# Patient Record
Sex: Female | Born: 1960
Health system: Southern US, Community
[De-identification: ages and names within clinical notes are randomized; demographics above are authoritative.]

## PROBLEM LIST (undated history)

## (undated) DIAGNOSIS — F419 Anxiety disorder, unspecified: Secondary | ICD-10-CM

## (undated) DIAGNOSIS — I499 Cardiac arrhythmia, unspecified: Secondary | ICD-10-CM

## (undated) DIAGNOSIS — K219 Gastro-esophageal reflux disease without esophagitis: Secondary | ICD-10-CM

## (undated) DIAGNOSIS — E785 Hyperlipidemia, unspecified: Secondary | ICD-10-CM

## (undated) DIAGNOSIS — T7840XA Allergy, unspecified, initial encounter: Secondary | ICD-10-CM

## (undated) DIAGNOSIS — E079 Disorder of thyroid, unspecified: Secondary | ICD-10-CM

## (undated) HISTORY — DX: Cardiac arrhythmia, unspecified: I49.9

## (undated) HISTORY — PX: ABDOMINAL HYSTERECTOMY: SHX81

## (undated) HISTORY — DX: Anxiety disorder, unspecified: F41.9

## (undated) HISTORY — DX: Gastro-esophageal reflux disease without esophagitis: K21.9

## (undated) HISTORY — DX: Hyperlipidemia, unspecified: E78.5

## (undated) HISTORY — DX: Disorder of thyroid, unspecified: E07.9

## (undated) HISTORY — DX: Allergy, unspecified, initial encounter: T78.40XA

---

## 1997-08-12 ENCOUNTER — Other Ambulatory Visit: Admission: RE | Admit: 1997-08-12 | Discharge: 1997-08-12 | Payer: Self-pay | Admitting: Obstetrics & Gynecology

## 1998-08-17 ENCOUNTER — Other Ambulatory Visit: Admission: RE | Admit: 1998-08-17 | Discharge: 1998-08-17 | Payer: Self-pay | Admitting: Obstetrics & Gynecology

## 1999-09-28 ENCOUNTER — Other Ambulatory Visit: Admission: RE | Admit: 1999-09-28 | Discharge: 1999-09-28 | Payer: Self-pay | Admitting: Obstetrics & Gynecology

## 2000-11-04 ENCOUNTER — Other Ambulatory Visit: Admission: RE | Admit: 2000-11-04 | Discharge: 2000-11-04 | Payer: Self-pay | Admitting: Obstetrics & Gynecology

## 2001-12-02 ENCOUNTER — Other Ambulatory Visit: Admission: RE | Admit: 2001-12-02 | Discharge: 2001-12-02 | Payer: Self-pay | Admitting: Obstetrics & Gynecology

## 2003-01-04 ENCOUNTER — Other Ambulatory Visit: Admission: RE | Admit: 2003-01-04 | Discharge: 2003-01-04 | Payer: Self-pay | Admitting: Obstetrics & Gynecology

## 2004-01-24 ENCOUNTER — Other Ambulatory Visit: Admission: RE | Admit: 2004-01-24 | Discharge: 2004-01-24 | Payer: Self-pay | Admitting: Obstetrics & Gynecology

## 2005-02-19 ENCOUNTER — Other Ambulatory Visit: Admission: RE | Admit: 2005-02-19 | Discharge: 2005-02-19 | Payer: Self-pay | Admitting: Obstetrics & Gynecology

## 2006-06-20 ENCOUNTER — Encounter: Admission: RE | Admit: 2006-06-20 | Discharge: 2006-06-20 | Payer: Self-pay | Admitting: Obstetrics & Gynecology

## 2007-07-29 ENCOUNTER — Encounter: Admission: RE | Admit: 2007-07-29 | Discharge: 2007-07-29 | Payer: Self-pay | Admitting: Obstetrics & Gynecology

## 2008-08-04 ENCOUNTER — Encounter: Admission: RE | Admit: 2008-08-04 | Discharge: 2008-08-04 | Payer: Self-pay | Admitting: Obstetrics & Gynecology

## 2008-08-06 ENCOUNTER — Encounter: Admission: RE | Admit: 2008-08-06 | Discharge: 2008-08-06 | Payer: Self-pay | Admitting: Obstetrics & Gynecology

## 2010-08-07 ENCOUNTER — Other Ambulatory Visit: Payer: Self-pay | Admitting: Obstetrics & Gynecology

## 2010-08-07 DIAGNOSIS — Z1231 Encounter for screening mammogram for malignant neoplasm of breast: Secondary | ICD-10-CM

## 2010-08-15 ENCOUNTER — Ambulatory Visit
Admission: RE | Admit: 2010-08-15 | Discharge: 2010-08-15 | Disposition: A | Discharge status: Home / Self Care | Payer: 59 | Source: Ambulatory Visit | Attending: Obstetrics & Gynecology | Admitting: Obstetrics & Gynecology

## 2010-08-15 DIAGNOSIS — Z1231 Encounter for screening mammogram for malignant neoplasm of breast: Secondary | ICD-10-CM

## 2011-08-07 ENCOUNTER — Other Ambulatory Visit: Payer: Self-pay | Admitting: Obstetrics & Gynecology

## 2011-08-07 DIAGNOSIS — Z1231 Encounter for screening mammogram for malignant neoplasm of breast: Secondary | ICD-10-CM

## 2011-08-21 ENCOUNTER — Ambulatory Visit
Admission: RE | Admit: 2011-08-21 | Discharge: 2011-08-21 | Disposition: A | Discharge status: Home / Self Care | Payer: 59 | Source: Ambulatory Visit | Attending: Obstetrics & Gynecology | Admitting: Obstetrics & Gynecology

## 2011-08-21 DIAGNOSIS — Z1231 Encounter for screening mammogram for malignant neoplasm of breast: Secondary | ICD-10-CM

## 2011-12-11 ENCOUNTER — Encounter: Payer: Self-pay | Admitting: Sports Medicine

## 2011-12-11 ENCOUNTER — Ambulatory Visit (INDEPENDENT_AMBULATORY_CARE_PROVIDER_SITE_OTHER): Payer: 59 | Admitting: Sports Medicine

## 2011-12-11 VITALS — BP 119/78 | HR 68 | Ht 64.0 in | Wt 135.0 lb

## 2011-12-11 DIAGNOSIS — M25579 Pain in unspecified ankle and joints of unspecified foot: Secondary | ICD-10-CM

## 2011-12-11 NOTE — Assessment & Plan Note (Addendum)
Patient's likely with her high arches bilaterally and pain on the lateral aspect where she did have swelling may have a sinus tarsus. This would also go along with the other intervention she's got by her orthopedic physician previously. At this point it seems to have resolved but she's not doing her regular activities at this time. We did give patient a ankle stabilization sleeve X ankle Helix and sports insoles. Given ankle rehabilitation exercises. Patient shoes did have significant tred wear. Discussed proper shoe wear for her to be beneficial. Patient will followup if she has any further ankle pain. At that time we'll consider doing more imaging such as ultrasound to make sure we have the right diagnosis.

## 2011-12-11 NOTE — Patient Instructions (Addendum)
Very nice to meet you.  We have given you a brace for your right ankle.  We also gave you sports insoles today with a heel wedge on the right side.  Try wearing these with running and if they help ear them at work as well.  If you are getting new shoes get one with a 5 degree curve with good forefoot cushioning.  You can go to off and running or [employer].  Do the exercises for your ankle and your your core strength that we gave you.  Come back in 6 weeks if you are still having any discomfort or pain then come back and we will discuss what we can do.

## 2011-12-11 NOTE — Progress Notes (Signed)
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 Patient states that this started in the month of June. Patient states that she had a very busy weekend at the beach. At that time she did go for a run, walk on the beach entire day, and then another 5 mile run the next day. When she went back to work at end of shift she had significant swelling of the lateral right side of her ankle it was somewhat painful. Patient does not remember any specific injury and has never had probably this previously. Patient states that the swelling seemed to improve very slowly over the course of the month.  Patient went and saw Dr. Victorino Dike for evaluation where she had x-rays done which was unremarkable as well as a Kenalog injection.  Patient does have a significant past medical history for a DDD and spondylolisthesis of the lumbar spine that is being followed by Dr. Victorino Dike has had no neurologic complications.  Otherwise family and social history is unremarkable.  ROS: 14 system review is done and unremarkable the stated in history of present illness.  Physical Exam  Filed Vitals:   12/11/11 1008  BP: 119/78  Pulse: 68  Gen: NAD alert and oriented x3. Mood and affect normal. Respiratory: Patient's case in full sentences and does not appear short of breath. Neurologic: Patient's dictated reflexes are 2+ and symmetric, patient is neurovascularly intact in all extremities. Patient has no edema of the lower extremes bilaterally.  Ankle: bilateral No visible erythema or swelling. Range of motion is full in all directions. Strength is 5/5 in all directions. Stable lateral and medial ligaments; squeeze test and kleiger test unremarkable; Talar dome nontender; No pain at base of 5th MT; No tenderness over cuboid; No tenderness over N spot or navicular prominence No tenderness on posterior aspects of lateral and medial malleolus No sign of peroneal tendon subluxations; Negative tarsal tunnel  tinel's Able to walk 4 steps. Patient does have high arch bilaterally.  Gait Patient ambulates without any significant changes. She does have some mild supination of the heel. When patient does jog patient is a midfoot runner and still has some moderate supination on the right side. Fore foot strike.

## 2012-07-01 ENCOUNTER — Other Ambulatory Visit: Payer: Self-pay

## 2012-07-01 DIAGNOSIS — Z1231 Encounter for screening mammogram for malignant neoplasm of breast: Secondary | ICD-10-CM

## 2012-08-25 ENCOUNTER — Ambulatory Visit
Admission: RE | Admit: 2012-08-25 | Discharge: 2012-08-25 | Disposition: A | Discharge status: Home / Self Care | Payer: 59 | Source: Ambulatory Visit

## 2012-08-25 DIAGNOSIS — Z1231 Encounter for screening mammogram for malignant neoplasm of breast: Secondary | ICD-10-CM

## 2012-08-28 ENCOUNTER — Other Ambulatory Visit: Payer: Self-pay | Admitting: Obstetrics & Gynecology

## 2012-08-28 DIAGNOSIS — R922 Inconclusive mammogram: Secondary | ICD-10-CM

## 2012-08-28 DIAGNOSIS — R9230 Dense breasts, unspecified: Secondary | ICD-10-CM

## 2012-09-06 ENCOUNTER — Other Ambulatory Visit: Payer: 59

## 2012-09-10 ENCOUNTER — Ambulatory Visit
Admission: RE | Admit: 2012-09-10 | Discharge: 2012-09-10 | Disposition: A | Discharge status: Home / Self Care | Payer: 59 | Source: Ambulatory Visit | Attending: Obstetrics & Gynecology | Admitting: Obstetrics & Gynecology

## 2012-09-10 DIAGNOSIS — R922 Inconclusive mammogram: Secondary | ICD-10-CM

## 2012-09-10 DIAGNOSIS — R9230 Dense breasts, unspecified: Secondary | ICD-10-CM

## 2012-09-10 MED ORDER — GADOBENATE DIMEGLUMINE 529 MG/ML IV SOLN
12.0000 mL | Freq: Once | INTRAVENOUS | Status: AC | PRN
  Administered 2012-09-10: 12 mL via INTRAVENOUS

## 2013-09-20 ENCOUNTER — Other Ambulatory Visit: Payer: Self-pay

## 2013-09-20 DIAGNOSIS — Z1231 Encounter for screening mammogram for malignant neoplasm of breast: Secondary | ICD-10-CM

## 2013-09-23 ENCOUNTER — Ambulatory Visit
Admission: RE | Admit: 2013-09-23 | Discharge: 2013-09-23 | Disposition: A | Discharge status: Home / Self Care | Payer: 59 | Source: Ambulatory Visit

## 2013-09-23 DIAGNOSIS — Z1231 Encounter for screening mammogram for malignant neoplasm of breast: Secondary | ICD-10-CM

## 2014-08-22 ENCOUNTER — Other Ambulatory Visit: Payer: Self-pay

## 2014-08-22 DIAGNOSIS — Z803 Family history of malignant neoplasm of breast: Secondary | ICD-10-CM

## 2014-08-22 DIAGNOSIS — Z1231 Encounter for screening mammogram for malignant neoplasm of breast: Secondary | ICD-10-CM

## 2014-09-27 ENCOUNTER — Ambulatory Visit
Admission: RE | Admit: 2014-09-27 | Discharge: 2014-09-27 | Disposition: A | Discharge status: Home / Self Care | Payer: 59 | Source: Ambulatory Visit

## 2014-09-27 DIAGNOSIS — Z1231 Encounter for screening mammogram for malignant neoplasm of breast: Secondary | ICD-10-CM

## 2014-09-27 DIAGNOSIS — Z803 Family history of malignant neoplasm of breast: Secondary | ICD-10-CM

## 2015-08-08 ENCOUNTER — Other Ambulatory Visit: Payer: Self-pay

## 2015-08-08 DIAGNOSIS — Z1231 Encounter for screening mammogram for malignant neoplasm of breast: Secondary | ICD-10-CM

## 2015-09-28 ENCOUNTER — Ambulatory Visit
Admission: RE | Admit: 2015-09-28 | Discharge: 2015-09-28 | Disposition: A | Discharge status: Home / Self Care | Payer: 59 | Source: Ambulatory Visit

## 2015-09-28 DIAGNOSIS — Z1231 Encounter for screening mammogram for malignant neoplasm of breast: Secondary | ICD-10-CM

## 2015-10-18 ENCOUNTER — Other Ambulatory Visit: Payer: Self-pay | Admitting: Obstetrics & Gynecology

## 2015-10-18 DIAGNOSIS — Z803 Family history of malignant neoplasm of breast: Secondary | ICD-10-CM

## 2015-10-30 ENCOUNTER — Ambulatory Visit
Admission: RE | Admit: 2015-10-30 | Discharge: 2015-10-30 | Disposition: A | Discharge status: Home / Self Care | Payer: 59 | Source: Ambulatory Visit | Attending: Obstetrics & Gynecology | Admitting: Obstetrics & Gynecology

## 2015-10-30 DIAGNOSIS — Z803 Family history of malignant neoplasm of breast: Secondary | ICD-10-CM

## 2015-10-30 MED ORDER — GADOBENATE DIMEGLUMINE 529 MG/ML IV SOLN: 12.0000 mL | Freq: Once | INTRAVENOUS | Status: DC | PRN

## 2016-07-04 DIAGNOSIS — H11123 Conjunctival concretions, bilateral: Secondary | ICD-10-CM | POA: Diagnosis not present

## 2016-07-04 DIAGNOSIS — H04123 Dry eye syndrome of bilateral lacrimal glands: Secondary | ICD-10-CM | POA: Diagnosis not present

## 2016-07-16 DIAGNOSIS — Z6824 Body mass index (BMI) 24.0-24.9, adult: Secondary | ICD-10-CM | POA: Diagnosis not present

## 2016-07-16 DIAGNOSIS — Z01419 Encounter for gynecological examination (general) (routine) without abnormal findings: Secondary | ICD-10-CM | POA: Diagnosis not present

## 2016-08-20 ENCOUNTER — Other Ambulatory Visit: Payer: Self-pay | Admitting: Obstetrics & Gynecology

## 2016-08-20 DIAGNOSIS — Z1231 Encounter for screening mammogram for malignant neoplasm of breast: Secondary | ICD-10-CM

## 2016-08-28 DIAGNOSIS — D225 Melanocytic nevi of trunk: Secondary | ICD-10-CM | POA: Diagnosis not present

## 2016-08-28 DIAGNOSIS — D2272 Melanocytic nevi of left lower limb, including hip: Secondary | ICD-10-CM | POA: Diagnosis not present

## 2016-09-30 ENCOUNTER — Ambulatory Visit
Admission: RE | Admit: 2016-09-30 | Discharge: 2016-09-30 | Disposition: A | Discharge status: Home / Self Care | Payer: 59 | Source: Ambulatory Visit | Attending: Obstetrics & Gynecology | Admitting: Obstetrics & Gynecology

## 2016-09-30 DIAGNOSIS — Z1231 Encounter for screening mammogram for malignant neoplasm of breast: Secondary | ICD-10-CM | POA: Diagnosis not present

## 2016-10-17 ENCOUNTER — Other Ambulatory Visit: Payer: Self-pay | Admitting: Obstetrics & Gynecology

## 2016-10-17 DIAGNOSIS — Z803 Family history of malignant neoplasm of breast: Secondary | ICD-10-CM

## 2016-11-06 ENCOUNTER — Ambulatory Visit
Admission: RE | Admit: 2016-11-06 | Discharge: 2016-11-06 | Disposition: A | Discharge status: Home / Self Care | Payer: 59 | Source: Ambulatory Visit | Attending: Obstetrics & Gynecology | Admitting: Obstetrics & Gynecology

## 2016-11-06 DIAGNOSIS — Z803 Family history of malignant neoplasm of breast: Secondary | ICD-10-CM

## 2016-11-06 DIAGNOSIS — C50919 Malignant neoplasm of unspecified site of unspecified female breast: Secondary | ICD-10-CM | POA: Diagnosis not present

## 2016-11-06 MED ORDER — GADOBENATE DIMEGLUMINE 529 MG/ML IV SOLN
12.0000 mL | Freq: Once | INTRAVENOUS | Status: AC | PRN
  Administered 2016-11-06: 12 mL via INTRAVENOUS

## 2017-02-13 DIAGNOSIS — Z23 Encounter for immunization: Secondary | ICD-10-CM | POA: Diagnosis not present

## 2017-05-06 ENCOUNTER — Encounter: Payer: Self-pay | Admitting: Family Medicine

## 2017-05-06 ENCOUNTER — Ambulatory Visit: Payer: 59 | Admitting: Family Medicine

## 2017-05-06 VITALS — BP 122/76 | HR 74 | Temp 97.5°F | Resp 17 | Ht 63.0 in | Wt 133.0 lb

## 2017-05-06 DIAGNOSIS — R635 Abnormal weight gain: Secondary | ICD-10-CM

## 2017-05-06 DIAGNOSIS — K219 Gastro-esophageal reflux disease without esophagitis: Secondary | ICD-10-CM | POA: Diagnosis not present

## 2017-05-06 DIAGNOSIS — Z130 Encounter for screening for diseases of the blood and blood-forming organs and certain disorders involving the immune mechanism: Secondary | ICD-10-CM

## 2017-05-06 DIAGNOSIS — L659 Nonscarring hair loss, unspecified: Secondary | ICD-10-CM

## 2017-05-06 DIAGNOSIS — Z131 Encounter for screening for diabetes mellitus: Secondary | ICD-10-CM

## 2017-05-06 DIAGNOSIS — Z13220 Encounter for screening for lipoid disorders: Secondary | ICD-10-CM

## 2017-05-06 DIAGNOSIS — Z23 Encounter for immunization: Secondary | ICD-10-CM | POA: Diagnosis not present

## 2017-05-06 DIAGNOSIS — R6889 Other general symptoms and signs: Secondary | ICD-10-CM | POA: Diagnosis not present

## 2017-05-06 DIAGNOSIS — Z1159 Encounter for screening for other viral diseases: Secondary | ICD-10-CM

## 2017-05-06 DIAGNOSIS — Z8349 Family history of other endocrine, nutritional and metabolic diseases: Secondary | ICD-10-CM | POA: Diagnosis not present

## 2017-05-06 DIAGNOSIS — Z114 Encounter for screening for human immunodeficiency virus [HIV]: Secondary | ICD-10-CM | POA: Diagnosis not present

## 2017-05-06 DIAGNOSIS — F418 Other specified anxiety disorders: Secondary | ICD-10-CM

## 2017-05-06 NOTE — Progress Notes (Signed)
Subjective:  This chart was scribed for Wendie Agreste, MD by Tamsen Roers, at [place] at [facility].  This patient was seen in room 8 and the patient's care was started at 8:40 AM.   Chief Complaint  Patient presents with  . Establish Care     Patient ID: Karen Pineda, female    DOB: [birth year], 57 y.o.   MRN: [id number]  HPI HPI Comments: Karen Pineda is a 57 y.o. female who presents to Primary Care at [facility] to establish care. Patient sees her gynecologist yearly (Dr. Nori Riis) and has been having lab work done in his office. She has seen Dr. Chalmers Cater (a couple years ago)  who was told that there were no issues regarding her thyroid.  Patient was initially concerned with her thyroid as she has had increased hair thinning the past couple months, some weight gain (which she attributes to not being able to exercise) and has cold hands/feet often. She denies any history of anemia.    Patient states that she has degenerative disc disease (has had an x-ray in the past) and has occasional tingling/numbness in her left hand. She would like to come in to discuss this at another office visit.   Health maintenance: She has had a hysterectomy (family history of ovarian cancer).  Her last colonoscopy was 2-3 years ago (Dr. Earlean Shawl) and goes every five years. Patient had a flu shot in November 2018. She would like a tdap today. She would like to have hepatitis and HIV screening today.   Medications: She is currently on Ranitidine ( for heart burn), a hormone patch and alprazolam ( when needed- has been taking it more often since her father passed).   Exercise:  Patient usually runs/walks for exercise but has not been able to do much as she was taking care of her father.  Patients father passed away on 2023/02/23 th.  Mother is deceased (had Sjogrens and pulmonary fibrosis).  Patient would not like any counseling during this time as she feels she has a really good support system.    Patient  Active Problem List   Diagnosis Date Noted  . Ankle pain 12/11/2011   No past medical history on file. No past surgical history on file. No Known Allergies Prior to Admission medications   Medication Sig Start Date End Date Taking? Authorizing Provider  ALPRAZolam Duanne Moron) 0.25 MG tablet Take 0.25 mg by mouth at bedtime as needed for anxiety.   Yes [provider]  aspirin EC 81 MG tablet Take 81 mg by mouth daily.   Yes [provider]  Calcium Carbonate-Vitamin D (CALTRATE 600+D) 600-400 MG-UNIT per tablet Take 1 tablet by mouth daily.   Yes [provider]  cholecalciferol (VITAMIN D) 1000 UNITS tablet Take 2,000 Units by mouth daily.   Yes [provider]  ibuprofen (ADVIL,MOTRIN) 200 MG tablet Take 400 mg by mouth every 6 (six) hours as needed.   Yes [provider]  MINIVELLE 0.1 MG/24HR Place 1 patch onto the skin 2 (two) times a week.  11/11/11  Yes [provider]  Multiple Vitamins-Minerals (CENTRUM SILVER PO) Take 1 tablet by mouth daily.   Yes [provider]  ranitidine (ZANTAC) 150 MG tablet Take 150 mg by mouth 2 (two) times daily.   Yes [provider]   Social History   Socioeconomic History  . Marital status: Married    Spouse name: Not on file  . Number of children: Not  on file  . Years of education: Not on file  . Highest education level: Not on file  Social Needs  . Financial resource strain: Not on file  . Food insecurity - worry: Not on file  . Food insecurity - inability: Not on file  . Transportation needs - medical: Not on file  . Transportation needs - non-medical: Not on file  Occupational History  . Not on file  Tobacco Use  . Smoking status: Never Smoker  . Smokeless tobacco: Never Used  Substance and Sexual Activity  . Alcohol use: Not on file  . Drug use: Not on file  . Sexual activity: Not on file  Other Topics Concern  . Not on file  Social History Narrative  . Not on  file   Review of Systems  Constitutional: Negative for chills and fever.  Eyes: Negative for pain, redness and itching.  Respiratory: Negative for cough, choking and shortness of breath.   Gastrointestinal: Negative for nausea and vomiting.  Neurological: Positive for numbness. Negative for weakness.      Objective:   Physical Exam  Constitutional: She is oriented to person, place, and time. She appears well-developed and well-nourished. No distress.  HENT:  Head: Normocephalic and atraumatic.  Eyes: Pupils are equal, round, and reactive to light.  Neck:  There are no palpable nodules, no thyromegaly.   Cardiovascular: Normal rate, regular rhythm and normal heart sounds. Exam reveals no friction rub.  No murmur heard. Pulmonary/Chest: Effort normal and breath sounds normal. No respiratory distress. She has no wheezes. She has no rales.  Neurological: She is alert and oriented to person, place, and time.  Skin: Skin is warm and dry.  Psychiatric: She has a normal mood and affect. Her behavior is normal.    Vitals:   05/06/17 0821  BP: 122/76  Pulse: 74  Resp: 17  Temp: (!) 97.5 F (36.4 C)  TempSrc: Oral  SpO2: 98%  Weight: 133 lb (60.3 kg)  Height: 5\' 3"  (1.6 m)      Assessment & Plan:   Karen Pineda is a 57 y.o. female Hair thinning - Plan: TSH + free T4 Family history of thyroid disease - Plan: TSH + free T4 Cold intolerance - Plan: TSH + free T4 Weight gain - Plan: TSH + free T4  - borderline thyroid by history with family history of disease.Some of weight gain may be related to decreased exercise. Check thyroid testing, then follow-up to discuss symptoms further.  Screening for diabetes mellitus - Plan: Comprehensive metabolic panel  Screening, anemia, deficiency, iron - Plan: CBC  Screening for hyperlipidemia - Plan: Lipid panel  Situational anxiety  - with normal grieving process. Has required some increased alprazolam with current grieving, but only  episodic use producing. RTC precautions if increased use/need and counseling resources can be provided if needed. Declined at present  Need for Tdap vaccination - Plan: Tdap vaccine greater than or equal to 7yo IM given  Encounter for hepatitis C screening test for low risk patient - Plan: Hepatitis C antibody  Screening for HIV (human immunodeficiency virus) - Plan: HIV antibody  Gastroesophageal reflux disease, esophagitis presence not specified  - trigger avoidance by handout, continue Zantac as needed.  Asked that she return for a physical and also to discuss back pain or other chronic issues. Return sooner if any worsening  No orders of the defined types were placed in this encounter.  Patient Instructions   Thanks for coming in today.  Ok to use xanax if needed for situational anxiety. If you require that more often, or worsening in your symptoms please return to discuss further.   Return to some form of exercise most days per week - walk/jog then work way up to running.   Please follow up to discuss back issues and if other new symptoms or areas are bothering you.   Schedule physical in next few months. We did cover a few of the items for physical today.   See foods to avoid below for heartburn. Ok to continue zantac as needed for now.   I will check thyroid testing as well as other blood work as discussed.   Food Choices for Gastroesophageal Reflux Disease, Adult When you have gastroesophageal reflux disease (GERD), the foods you eat and your eating habits are very important. Choosing the right foods can help ease your discomfort. What guidelines do I need to follow?  Choose fruits, vegetables, whole grains, and low-fat dairy products.  Choose low-fat meat, fish, and poultry.  Limit fats such as oils, salad dressings, butter, nuts, and avocado.  Keep a food diary. This helps you identify foods that cause symptoms.  Avoid foods that cause symptoms. These may be  different for everyone.  Eat small meals often instead of 3 large meals a day.  Eat your meals slowly, in a place where you are relaxed.  Limit fried foods.  Cook foods using methods other than frying.  Avoid drinking alcohol.  Avoid drinking large amounts of liquids with your meals.  Avoid bending over or lying down until 2-3 hours after eating. What foods are not recommended? These are some foods and drinks that may make your symptoms worse: Vegetables Tomatoes. Tomato juice. Tomato and spaghetti sauce. Chili peppers. Onion and garlic. Horseradish. Fruits Oranges, grapefruit, and lemon (fruit and juice). Meats High-fat meats, fish, and poultry. This includes hot dogs, ribs, ham, sausage, salami, and bacon. Dairy Whole milk and chocolate milk. Sour cream. Cream. Butter. Ice cream. Cream cheese. Drinks Coffee and tea. Bubbly (carbonated) drinks or energy drinks. Condiments Hot sauce. Barbecue sauce. Sweets/Desserts Chocolate and cocoa. Donuts. Peppermint and spearmint. Fats and Oils High-fat foods. This includes [place] fries and potato chips. Other Vinegar. Strong spices. This includes black pepper, white pepper, red pepper, cayenne, curry powder, cloves, ginger, and chili powder. The items listed above may not be a complete list of foods and drinks to avoid. Contact your dietitian for more information. This information is not intended to replace advice given to you by your health care provider. Make sure you discuss any questions you have with your health care provider. Document Released: 09/24/2011 Document Revised: 08/31/2015 Document Reviewed: 01/27/2013 Elsevier Interactive Patient Education  2017 [employer].   IF you received an x-ray today, you will receive an invoice from [facility]. Please contact [facility] Rad[facility] at [phone] with questions or concerns regarding your invoice.   IF you received labwork today, you will receive an invoice from  [place]. Please contact [facility] at [phone] with questions or concerns regarding your invoice.   Our billing staff will not be able to assist you with questions regarding bills from these companies.  You will be contacted with the lab results as soon as they are available. The fastest way to get your results is to activate your My Chart account. Instructions are located on the last page of this paperwork. If you have not heard from [place] regarding the results in 2 weeks, please contact this office.  I personally performed the services described in this documentation, which was scribed in my presence. The recorded information has been reviewed and considered for accuracy and completeness, addended by me as needed, and agree with information above.  Signed,   Merri Ray, MD Primary Care at [place].  05/06/17 9:06 AM

## 2017-05-06 NOTE — Patient Instructions (Addendum)
Thanks for coming in today.   Ok to use xanax if needed for situational anxiety. If you require that more often, or worsening in your symptoms please return to discuss further.   Return to some form of exercise most days per week - walk/jog then work way up to running.   Please follow up to discuss back issues and if other new symptoms or areas are bothering you.   Schedule physical in next few months. We did cover a few of the items for physical today.   See foods to avoid below for heartburn. Ok to continue zantac as needed for now.   I will check thyroid testing as well as other blood work as discussed.   Food Choices for Gastroesophageal Reflux Disease, Adult When you have gastroesophageal reflux disease (GERD), the foods you eat and your eating habits are very important. Choosing the right foods can help ease your discomfort. What guidelines do I need to follow?  Choose fruits, vegetables, whole grains, and low-fat dairy products.  Choose low-fat meat, fish, and poultry.  Limit fats such as oils, salad dressings, butter, nuts, and avocado.  Keep a food diary. This helps you identify foods that cause symptoms.  Avoid foods that cause symptoms. These may be different for everyone.  Eat small meals often instead of 3 large meals a day.  Eat your meals slowly, in a place where you are relaxed.  Limit fried foods.  Cook foods using methods other than frying.  Avoid drinking alcohol.  Avoid drinking large amounts of liquids with your meals.  Avoid bending over or lying down until 2-3 hours after eating. What foods are not recommended? These are some foods and drinks that may make your symptoms worse: Vegetables Tomatoes. Tomato juice. Tomato and spaghetti sauce. Chili peppers. Onion and garlic. Horseradish. Fruits Oranges, grapefruit, and lemon (fruit and juice). Meats High-fat meats, fish, and poultry. This includes hot dogs, ribs, ham, sausage, salami, and  bacon. Dairy Whole milk and chocolate milk. Sour cream. Cream. Butter. Ice cream. Cream cheese. Drinks Coffee and tea. Bubbly (carbonated) drinks or energy drinks. Condiments Hot sauce. Barbecue sauce. Sweets/Desserts Chocolate and cocoa. Donuts. Peppermint and spearmint. Fats and Oils High-fat foods. This includes [place] fries and potato chips. Other Vinegar. Strong spices. This includes black pepper, white pepper, red pepper, cayenne, curry powder, cloves, ginger, and chili powder. The items listed above may not be a complete list of foods and drinks to avoid. Contact your dietitian for more information. This information is not intended to replace advice given to you by your health care provider. Make sure you discuss any questions you have with your health care provider. Document Released: 09/24/2011 Document Revised: 08/31/2015 Document Reviewed: 01/27/2013 Elsevier Interactive Patient Education  2017 [employer].   IF you received an x-ray today, you will receive an invoice from [facility]. Please contact [facility] Radiology at [phone] with questions or concerns regarding your invoice.   IF you received labwork today, you will receive an invoice from [place]. Please contact [facility] at [phone] with questions or concerns regarding your invoice.   Our billing staff will not be able to assist you with questions regarding bills from these companies.  You will be contacted with the lab results as soon as they are available. The fastest way to get your results is to activate your My Chart account. Instructions are located on the last page of this paperwork. If you have not heard from [place] regarding the results in 2 weeks,  please contact this office.

## 2017-05-08 LAB — COMPREHENSIVE METABOLIC PANEL
ALBUMIN: 4.6 g/dL (ref 3.5–5.5)
ALT: 18 IU/L (ref 0–32)
AST: 20 IU/L (ref 0–40)
Albumin/Globulin Ratio: 1.7 (ref 1.2–2.2)
Alkaline Phosphatase: 64 IU/L (ref 39–117)
BUN / CREAT RATIO: 10 (ref 9–23)
BUN: 9 mg/dL (ref 6–24)
Bilirubin Total: 0.5 mg/dL (ref 0.0–1.2)
CALCIUM: 9.9 mg/dL (ref 8.7–10.2)
CO2: 22 mmol/L (ref 20–29)
CREATININE: 0.87 mg/dL (ref 0.57–1.00)
Chloride: 100 mmol/L (ref 96–106)
GFR, EST AFRICAN AMERICAN: 85 mL/min/{1.73_m2} (ref >59)
GFR, EST NON AFRICAN AMERICAN: 74 mL/min/{1.73_m2} (ref >59)
GLOBULIN, TOTAL: 2.7 g/dL (ref 1.5–4.5)
GLUCOSE: 89 mg/dL (ref 65–99)
Potassium: 4.6 mmol/L (ref 3.5–5.2)
SODIUM: 144 mmol/L (ref 134–144)
TOTAL PROTEIN: 7.3 g/dL (ref 6.0–8.5)

## 2017-05-08 LAB — CBC
Hematocrit: 38.5 % (ref 34.0–46.6)
Hemoglobin: 13 g/dL (ref 11.1–15.9)
MCH: 30.7 pg (ref 26.6–33.0)
MCHC: 33.8 g/dL (ref 31.5–35.7)
MCV: 91 fL (ref 79–97)
PLATELETS: 241 10*3/uL (ref 150–379)
RBC: 4.24 x10E6/uL (ref 3.77–5.28)
RDW: 13.1 % (ref 12.3–15.4)
WBC: 6.1 10*3/uL (ref 3.4–10.8)

## 2017-05-08 LAB — HIV ANTIBODY (ROUTINE TESTING W REFLEX): HIV Screen 4th Generation wRfx: NONREACTIVE

## 2017-05-08 LAB — LIPID PANEL
CHOL/HDL RATIO: 2.2 ratio (ref 0.0–4.4)
Cholesterol, Total: 219 mg/dL — ABNORMAL HIGH (ref 100–199)
HDL: 98 mg/dL (ref >39)
LDL CALC: 109 mg/dL — AB (ref 0–99)
Triglycerides: 59 mg/dL (ref 0–149)
VLDL CHOLESTEROL CAL: 12 mg/dL (ref 5–40)

## 2017-05-08 LAB — TSH+FREE T4
Free T4: 0.95 ng/dL (ref 0.82–1.77)
TSH: 1.97 u[IU]/mL (ref 0.450–4.500)

## 2017-05-08 LAB — HEPATITIS C ANTIBODY

## 2017-07-14 ENCOUNTER — Encounter: Payer: 59 | Admitting: Family Medicine

## 2017-07-17 DIAGNOSIS — Z6823 Body mass index (BMI) 23.0-23.9, adult: Secondary | ICD-10-CM | POA: Diagnosis not present

## 2017-07-17 DIAGNOSIS — Z01419 Encounter for gynecological examination (general) (routine) without abnormal findings: Secondary | ICD-10-CM | POA: Diagnosis not present

## 2017-07-18 DIAGNOSIS — J019 Acute sinusitis, unspecified: Secondary | ICD-10-CM | POA: Diagnosis not present

## 2017-08-18 ENCOUNTER — Encounter: Payer: Self-pay | Admitting: Family Medicine

## 2017-08-18 ENCOUNTER — Ambulatory Visit (INDEPENDENT_AMBULATORY_CARE_PROVIDER_SITE_OTHER): Payer: 59 | Admitting: Family Medicine

## 2017-08-18 ENCOUNTER — Other Ambulatory Visit: Payer: Self-pay

## 2017-08-18 ENCOUNTER — Ambulatory Visit (INDEPENDENT_AMBULATORY_CARE_PROVIDER_SITE_OTHER): Payer: 59

## 2017-08-18 VITALS — BP 110/62 | HR 64 | Temp 97.6°F | Ht 63.78 in | Wt 133.0 lb

## 2017-08-18 DIAGNOSIS — F418 Other specified anxiety disorders: Secondary | ICD-10-CM

## 2017-08-18 DIAGNOSIS — M5136 Other intervertebral disc degeneration, lumbar region: Secondary | ICD-10-CM | POA: Diagnosis not present

## 2017-08-18 DIAGNOSIS — M545 Low back pain, unspecified: Secondary | ICD-10-CM

## 2017-08-18 DIAGNOSIS — Z23 Encounter for immunization: Secondary | ICD-10-CM

## 2017-08-18 DIAGNOSIS — Z0000 Encounter for general adult medical examination without abnormal findings: Secondary | ICD-10-CM

## 2017-08-18 DIAGNOSIS — M51369 Other intervertebral disc degeneration, lumbar region without mention of lumbar back pain or lower extremity pain: Secondary | ICD-10-CM | POA: Insufficient documentation

## 2017-08-18 MED ORDER — ZOSTER VAC RECOMB ADJUVANTED 50 MCG/0.5ML IM SUSR: 0.5000 mL | Freq: Once | INTRAMUSCULAR | 1 refills | Status: AC

## 2017-08-18 NOTE — Progress Notes (Signed)
Subjective:  By signing my name below, I, Moises Blood, attest that this documentation has been prepared under the direction and in the presence of Merri Ray, MD. Electronically Signed: Moises Blood, [place]. 08/18/2017 , 8:56 AM .  Patient was seen in Room 11 .   Patient ID: Karen Pineda, female    DOB: Oct 20, 1960, 57 y.o.   MRN: [id number] Chief Complaint  Patient presents with  . Annual Exam    CPE   HPI Karen Pineda is a 57 y.o. female Here for annual physical. I last saw patient in January to establish care. She is fasting today.   She previously worked as a Software engineer at [employer].   Hair thinning She has a history of hair thinning, evaluated by endocrinology previously. Advised she did not have any thyroid issues and normal thyroid testing in January.   DDD She also reports DDD in her neck with occasional tingling in her left hand. She mentions having a prominent area over her low back. She does have some stiffness when waking up. She also notes tingling in her legs if she sits down for a long time. She denies incontinence or saddle anesthesia.   Heartburn She also has heartburn and takes zantac daily.   Situational anxiety  Since the passing of her father with episodic use of alprazolam. She's been taking half tablet very sparingly, generally with lack of sleep.   Lipid screening Lab Results  Component Value Date   CHOL 219 (H) 05/06/2017   HDL 98 05/06/2017   LDLCALC 109 (H) 05/06/2017   TRIG 59 05/06/2017   CHOLHDL 2.2 05/06/2017   The 10-year ASCVD risk score Mikey Bussing DC Jr., et al., 2013) is: 1.6%   Values used to calculate the score:     Age: 58 years     Sex: Female     Is Non-Hispanic African American: No     Diabetic: No     Tobacco smoker: No     Systolic Blood Pressure: 350 mmHg     Is BP treated: No     HDL Cholesterol: 98 mg/dL     Total Cholesterol: 219 mg/dL   Cancer Screening Colonoscopy: 02/20/14, repeat in 5 years due to family  history done by Dr. Earlean Shawl.  Mammogram: 09/30/16, done by Dr. Nori Riis.  Pap smear: 07/16/17, OBGYN is Dr. Nori Riis.  Skin cancer screening: followed by [facility], Dr. Ubaldo Glassing, checked annually.   Immunizations There is no immunization history for the selected administration types on file for this patient.   Tetanus: Updated today.  Shingles: She had Zostervax previously (early 46s), prescription sent in.  Flu vaccine: received Fall 2018.  Pneumovax: hasn't done yet.    Depression Depression screen [facility] 2/9 08/18/2017 05/06/2017  Decreased Interest 0 0  Down, Depressed, Hopeless 0 0  PHQ - 2 Score 0 0    Vision  Visual Acuity Screening   Right eye Left eye Both eyes  Without correction:     With correction: 20/30-2 20/25 20/20-1   She has an appointment on Friday, 08/22/17.   Dentist She is followed by dentist every 6 months.   Exercise She's getting back into exercise, with running and went to the gym last night on the [place].   Patient Active Problem List   Diagnosis Date Noted  . Ankle pain 12/11/2011   Past Medical History:  Diagnosis Date  . Allergy    seasonal  . Anxiety    occasional  . GERD (gastroesophageal reflux  disease)    occasional  . Thyroid disease    Past Surgical History:  Procedure Laterality Date  . ABDOMINAL HYSTERECTOMY     No Known Allergies Prior to Admission medications   Medication Sig Start Date End Date Taking? Authorizing Provider  ALPRAZolam (XANAX) 0.25 MG tablet Take 0.125 mg by mouth daily as needed for anxiety.    Yes [provider]  Calcium Carbonate-Vitamin D (CALTRATE 600+D) 600-400 MG-UNIT per tablet Take 1 tablet by mouth daily.   Yes [provider]  cholecalciferol (VITAMIN D) 1000 UNITS tablet Take 2,000 Units by mouth daily.   Yes [provider]  ibuprofen (ADVIL,MOTRIN) 200 MG tablet Take 200 mg by mouth every 6 (six) hours as needed.    Yes [provider]  MINIVELLE 0.1  MG/24HR Place 1 patch onto the skin 2 (two) times a week.  11/11/11  Yes [provider]  Multiple Vitamins-Minerals (CENTRUM SILVER PO) Take 1 tablet by mouth daily.   Yes [provider]  ranitidine (ZANTAC) 150 MG tablet Take 150 mg by mouth 2 (two) times daily.   Yes [provider]   Social History   Socioeconomic History  . Marital status: Married    Spouse name: Not on file  . Number of children: Not on file  . Years of education: Not on file  . Highest education level: Not on file  Occupational History  . Not on file  Social Needs  . Financial resource strain: Not on file  . Food insecurity:    Worry: Not on file    Inability: Not on file  . Transportation needs:    Medical: Not on file    Non-medical: Not on file  Tobacco Use  . Smoking status: Never Smoker  . Smokeless tobacco: Never Used  Substance and Sexual Activity  . Alcohol use: Not on file  . Drug use: Not on file  . Sexual activity: Not on file  Lifestyle  . Physical activity:    Days per week: Not on file    Minutes per session: Not on file  . Stress: Not on file  Relationships  . Social connections:    Talks on phone: Not on file    Gets together: Not on file    Attends religious service: Not on file    Active member of club or organization: Not on file    Attends meetings of clubs or organizations: Not on file    Relationship status: Not on file  . Intimate partner violence:    Fear of current or ex partner: Not on file    Emotionally abused: Not on file    Physically abused: Not on file    Forced sexual activity: Not on file  Other Topics Concern  . Not on file  Social History Narrative  . Not on file   Review of Systems 13 point ROS - negative     Objective:   Physical Exam  Constitutional: She is oriented to person, place, and time. She appears well-developed and well-nourished.  HENT:  Head: Normocephalic and atraumatic.  Right Ear: External ear normal.    Left Ear: External ear normal.  Mouth/Throat: Oropharynx is clear and moist.  Eyes: Pupils are equal, round, and reactive to light. Conjunctivae are normal.  Neck: Normal range of motion. Neck supple. No thyromegaly present.  Cardiovascular: Normal rate, regular rhythm, normal heart sounds and intact distal pulses.  No murmur heard. Pulmonary/Chest: Effort normal and breath sounds normal.  No respiratory distress. She has no wheezes.  Abdominal: Soft. Bowel sounds are normal. There is no tenderness.  Musculoskeletal: Normal range of motion. She exhibits no edema or tenderness.  Lower lumbar spine: there is some prominence of the upper lumbar spine accentuated curvature  Lymphadenopathy:    She has no cervical adenopathy.  Neurological: She is alert and oriented to person, place, and time.  Skin: Skin is warm and dry. No rash noted.  Psychiatric: She has a normal mood and affect. Her behavior is normal. Thought content normal.    Vitals:   08/18/17 0819  BP: 110/62  Pulse: 64  Temp: 97.6 F (36.4 C)  TempSrc: Oral  SpO2: 99%  Weight: 133 lb (60.3 kg)  Height: 5' 3.78" (1.62 m)   Dg Lumbar Spine 2-3 Views  Result Date: 08/18/2017 CLINICAL DATA:  Episodic low back pain extending into the right leg. EXAM: LUMBAR SPINE - 2-3 VIEW COMPARISON:  None. FINDINGS: There are 5 nonrib bearing lumbar-type vertebral bodies. The vertebral body heights are maintained. There is grade 1 anterolisthesis of L4 on L5 secondary to bilateral L4 pars interarticularis defects. There is no spondylolysis. There is no acute fracture. There is severe degenerative disc disease with disc height loss at L4-5 and L5-S1. There is bilateral facet arthropathy at L5-S1. The SI joints are unremarkable. There is a large amount of stool throughout the colon. IMPRESSION: 1. Lumbar spine spondylosis as described above. Electronically Signed   By: Kathreen Devoid   On: 08/18/2017 09:12       Assessment & Plan:   AZALIE HARBECK is a 57 y.o. female Annual physical exam  - -anticipatory guidance as below in AVS, screening labs above. Health maintenance items as above in HPI discussed/recommended as applicable.    - mild hyperlipidemia, but great HDL.  Very low ASCVD ten-year risk, would not recommend statin based on January labs and will hold on repeat testing today.  Can consider repeat test in 6 months.  Need for Tdap vaccination - Plan: Tdap vaccine greater than or equal to 7yo IM  Need for shingles vaccine - Plan: Zoster Vaccine Adjuvanted [facility]) injection  - Sent to pharmacy  Episodic low back pain - Plan: DG Lumbar Spine 2-3 Views  -Marked degenerative disc disease L4-5/L5-S1 with grade 1 spondylolisthesis, likely degenerative in nature.  Overall minimal symptomatology at present.  Option of follow-up visit  if needed for any increased symptoms.    - Symptomatic care with occasional Tylenol, rare NSAID and low intensity exercise as tolerated for now.  RTC precautions of acute worsening.  Red flag symptoms also discussed with RTC/ER precautions  Meds ordered this encounter  Medications  . Zoster Vaccine Adjuvanted [facility]) injection    Sig: Inject 0.5 mLs into the muscle once for 1 dose. Repeat in 2-6 months.    Dispense:  0.5 mL    Refill:  1   Patient Instructions   Tdap vaccine given today. Shingrix sent to your pharmacy.  Range of motion, tylenol if needed for back pain. Occasional nsaid if needed with food. Return to the clinic or go to the nearest emergency room if any of your symptoms worsen or new symptoms occur.  No med changes for now. Based on last cholesterol reading I would not recommend a statin. We can repeat that testing in next 6 months with other bloodwork.   Thanks for coming in today. Let me know if there are questions.   Keeping You Healthy  Get  These Tests  Blood Pressure- Have your blood pressure checked by your healthcare provider at least once a year.  Normal blood  pressure is 120/80.  Weight- Have your body mass index (BMI) calculated to screen for obesity.  BMI is a measure of body fat based on height and weight.  You can calculate your own BMI at [employer]  Cholesterol- Have your cholesterol checked every year.  Diabetes- Have your blood sugar checked every year if you have high blood pressure, high cholesterol, a family history of diabetes or if you are overweight.  Pap Test - Have a pap test every 1 to 5 years if you have been sexually active.  If you are older than 65 and recent pap tests have been normal you may not need additional pap tests.  In addition, if you have had a hysterectomy  for benign disease additional pap tests are not necessary.  Mammogram-Yearly mammograms are essential for early detection of breast cancer  Screening for Colon Cancer- Colonoscopy starting at age 44. Screening may begin sooner depending on your family history and other health conditions.  Follow up colonoscopy as directed by your Gastroenterologist.  Screening for Osteoporosis- Screening begins at age 20 with bone density scanning, sooner if you are at higher risk for developing Osteoporosis.  Get these medicines  Calcium with Vitamin D- Your body requires 1200-1500 mg of Calcium a day and [phone] IU of Vitamin D a day.  You can only absorb 500 mg of Calcium at a time therefore Calcium must be taken in 2 or 3 separate doses throughout the day. Hormones- Hormone therapy has been associated with increased risk for certain cancers and heart disease.  Talk to your healthcare provider about if you need relief from menopausal symptoms.  Get these Immuniztions  Flu shot- Every fall  Pneumonia shot- Once after the age of 20; if you are younger ask your healthcare provider if you need a pneumonia shot.  Tetanus- Every ten years.  Zostavax- Once after the age of 59 to prevent shingles.  Take these steps  Don't smoke- Your healthcare provider can  help you quit. For tips on how to quit, ask your healthcare provider or go to www.smokefree.gov or call 1-800 QUIT-NOW.  Be physically active- Exercise 5 days a week for a minimum of 30 minutes.  If you are not already physically active, start slow and gradually work up to 30 minutes of moderate physical activity.  Try walking, dancing, bike riding, swimming, etc.  Eat a healthy diet- Eat a variety of healthy foods such as fruits, vegetables, whole grains, low fat milk, low fat cheeses, yogurt, lean meats, chicken, fish, eggs, dried beans, tofu, etc.  For more information go to www.thenutritionsource.org  Dental visit- Brush and floss teeth twice daily; visit your dentist twice a year.  Eye exam- Visit your Optometrist or Ophthalmologist yearly.  Drink alcohol in moderation- Limit alcohol intake to one drink or less a day.  Never drink and drive.  Depression- Your emotional health is as important as your physical health.  If you're feeling down or losing interest in things you normally enjoy, please talk to your healthcare provider.  Seat Belts- can save your life; always wear one  Smoke/Carbon Monoxide detectors- These detectors need to be installed on the appropriate level of your home.  Replace batteries at least once a year.  Violence- If anyone is threatening or hurting you, please tell your healthcare provider.  Living Will/ Health care power of attorney- Discuss  with your healthcare provider and family.   IF you received an x-ray today, you will receive an invoice from [facility]. Please contact [facility] at [phone] with questions or concerns regarding your invoice.   IF you received labwork today, you will receive an invoice from [place]. Please contact [facility] at [phone] with questions or concerns regarding your invoice.   Our billing staff will not be able to assist you with questions regarding bills from these companies.  You will be contacted  with the lab results as soon as they are available. The fastest way to get your results is to activate your My Chart account. Instructions are located on the last page of this paperwork. If you have not heard from [place] regarding the results in 2 weeks, please contact this office.       I personally performed the services described in this documentation, which was scribed in my presence. The recorded information has been reviewed and considered for accuracy and completeness, addended by me as needed, and agree with information above.  Signed,   Merri Ray, MD [facility] [place].  08/18/17 9:25 AM

## 2017-08-18 NOTE — Patient Instructions (Addendum)
Tdap vaccine given today. Shingrix sent to your pharmacy.  Range of motion, tylenol if needed for back pain. Occasional nsaid if needed with food. Return to the clinic or go to the nearest emergency room if any of your symptoms worsen or new symptoms occur.  No med changes for now. Based on last cholesterol reading I would not recommend a statin. We can repeat that testing in next 6 months with other bloodwork.   Thanks for coming in today. Let me know if there are questions.   Keeping You Healthy  Get These Tests  Blood Pressure- Have your blood pressure checked by your healthcare provider at least once a year.  Normal blood pressure is 120/80.  Weight- Have your body mass index (BMI) calculated to screen for obesity.  BMI is a measure of body fat based on height and weight.  You can calculate your own BMI at [employer]  Cholesterol- Have your cholesterol checked every year.  Diabetes- Have your blood sugar checked every year if you have high blood pressure, high cholesterol, a family history of diabetes or if you are overweight.  Pap Test - Have a pap test every 1 to 5 years if you have been sexually active.  If you are older than 65 and recent pap tests have been normal you may not need additional pap tests.  In addition, if you have had a hysterectomy  for benign disease additional pap tests are not necessary.  Mammogram-Yearly mammograms are essential for early detection of breast cancer  Screening for Colon Cancer- Colonoscopy starting at age 14. Screening may begin sooner depending on your family history and other health conditions.  Follow up colonoscopy as directed by your Gastroenterologist.  Screening for Osteoporosis- Screening begins at age 44 with bone density scanning, sooner if you are at higher risk for developing Osteoporosis.  Get these medicines  Calcium with Vitamin D- Your body requires 1200-1500 mg of Calcium a day and [phone] IU of Vitamin D a  day.  You can only absorb 500 mg of Calcium at a time therefore Calcium must be taken in 2 or 3 separate doses throughout the day. Hormones- Hormone therapy has been associated with increased risk for certain cancers and heart disease.  Talk to your healthcare provider about if you need relief from menopausal symptoms.  Get these Immuniztions  Flu shot- Every fall  Pneumonia shot- Once after the age of 26; if you are younger ask your healthcare provider if you need a pneumonia shot.  Tetanus- Every ten years.  Zostavax- Once after the age of 84 to prevent shingles.  Take these steps  Don't smoke- Your healthcare provider can help you quit. For tips on how to quit, ask your healthcare provider or go to www.smokefree.gov or call 1-800 QUIT-NOW.  Be physically active- Exercise 5 days a week for a minimum of 30 minutes.  If you are not already physically active, start slow and gradually work up to 30 minutes of moderate physical activity.  Try walking, dancing, bike riding, swimming, etc.  Eat a healthy diet- Eat a variety of healthy foods such as fruits, vegetables, whole grains, low fat milk, low fat cheeses, yogurt, lean meats, chicken, fish, eggs, dried beans, tofu, etc.  For more information go to www.thenutritionsource.org  Dental visit- Brush and floss teeth twice daily; visit your dentist twice a year.  Eye exam- Visit your Optometrist or Ophthalmologist yearly.  Drink alcohol in moderation- Limit alcohol intake to one drink or less a  day.  Never drink and drive.  Depression- Your emotional health is as important as your physical health.  If you're feeling down or losing interest in things you normally enjoy, please talk to your healthcare provider.  Seat Belts- can save your life; always wear one  Smoke/Carbon Monoxide detectors- These detectors need to be installed on the appropriate level of your home.  Replace batteries at least once a year.  Violence- If anyone is  threatening or hurting you, please tell your healthcare provider.  Living Will/ Health care power of attorney- Discuss with your healthcare provider and family.   IF you received an x-ray today, you will receive an invoice from [facility]. Please contact [facility] at [phone] with questions or concerns regarding your invoice.   IF you received labwork today, you will receive an invoice from [place]. Please contact [facility] at [phone] with questions or concerns regarding your invoice.   Our billing staff will not be able to assist you with questions regarding bills from these companies.  You will be contacted with the lab results as soon as they are available. The fastest way to get your results is to activate your My Chart account. Instructions are located on the last page of this paperwork. If you have not heard from [place] regarding the results in 2 weeks, please contact this office.

## 2017-10-05 DIAGNOSIS — L03119 Cellulitis of unspecified part of limb: Secondary | ICD-10-CM | POA: Diagnosis not present

## 2017-10-06 ENCOUNTER — Other Ambulatory Visit: Payer: Self-pay | Admitting: Obstetrics & Gynecology

## 2017-10-06 DIAGNOSIS — Z1231 Encounter for screening mammogram for malignant neoplasm of breast: Secondary | ICD-10-CM

## 2017-11-03 ENCOUNTER — Ambulatory Visit
Admission: RE | Admit: 2017-11-03 | Discharge: 2017-11-03 | Disposition: A | Discharge status: Home / Self Care | Payer: 59 | Source: Ambulatory Visit | Attending: Obstetrics & Gynecology | Admitting: Obstetrics & Gynecology

## 2017-11-03 DIAGNOSIS — Z1231 Encounter for screening mammogram for malignant neoplasm of breast: Secondary | ICD-10-CM

## 2017-11-04 DIAGNOSIS — H2513 Age-related nuclear cataract, bilateral: Secondary | ICD-10-CM | POA: Diagnosis not present

## 2017-11-04 DIAGNOSIS — H43813 Vitreous degeneration, bilateral: Secondary | ICD-10-CM | POA: Diagnosis not present

## 2017-11-04 DIAGNOSIS — H04123 Dry eye syndrome of bilateral lacrimal glands: Secondary | ICD-10-CM | POA: Diagnosis not present

## 2017-11-21 ENCOUNTER — Other Ambulatory Visit: Payer: Self-pay | Admitting: Obstetrics & Gynecology

## 2017-11-21 DIAGNOSIS — Z803 Family history of malignant neoplasm of breast: Secondary | ICD-10-CM

## 2017-12-04 ENCOUNTER — Ambulatory Visit
Admission: RE | Admit: 2017-12-04 | Discharge: 2017-12-04 | Disposition: A | Discharge status: Home / Self Care | Payer: 59 | Source: Ambulatory Visit | Attending: Obstetrics & Gynecology | Admitting: Obstetrics & Gynecology

## 2017-12-04 DIAGNOSIS — Z803 Family history of malignant neoplasm of breast: Secondary | ICD-10-CM

## 2017-12-04 MED ORDER — GADOBENATE DIMEGLUMINE 529 MG/ML IV SOLN
12.0000 mL | Freq: Once | INTRAVENOUS | Status: AC | PRN
  Administered 2017-12-04: 12 mL via INTRAVENOUS

## 2018-01-07 DIAGNOSIS — B999 Unspecified infectious disease: Secondary | ICD-10-CM | POA: Diagnosis not present

## 2018-02-16 ENCOUNTER — Encounter: Payer: Self-pay | Admitting: Family Medicine

## 2018-02-16 ENCOUNTER — Other Ambulatory Visit: Payer: Self-pay

## 2018-02-16 ENCOUNTER — Ambulatory Visit: Payer: 59 | Admitting: Family Medicine

## 2018-02-16 VITALS — BP 110/68 | HR 67 | Temp 97.6°F | Ht 63.0 in | Wt 134.6 lb

## 2018-02-16 DIAGNOSIS — E785 Hyperlipidemia, unspecified: Secondary | ICD-10-CM

## 2018-02-16 DIAGNOSIS — Z111 Encounter for screening for respiratory tuberculosis: Secondary | ICD-10-CM | POA: Diagnosis not present

## 2018-02-16 DIAGNOSIS — Z23 Encounter for immunization: Secondary | ICD-10-CM

## 2018-02-16 NOTE — Progress Notes (Signed)
Subjective:    Patient ID: Karen Pineda, female    DOB: 1960-10-23, 57 y.o.   MRN: [id number]  HPI Karen Pineda is a 57 y.o. female Presents today for: Chief Complaint  Patient presents with  . 6 month check up   Situational anxiety: Episodic Xanax use for situational anxiety when discussed last visit in May.  Had used rarely after her father's passing.   Has changed Zantac to famotidine. Takes daily - controlling symptoms.   DDD of cervical spine and low back pain.   Episodic symptoms noted at last visit.  Marked degenerative disc disease noted on prior lumbar spine x-ray.  Minimal symptomatology at that time.  Over-the-counter Tylenol or rare NSAID discussed initially with RTC precautions.  Rare need for tylenol, only rare ibuprofen - managing well.   Hyperlipidemia: Recommended diet and exercise approach initially with recheck in 6 months. Some exercise 3-4 days per week. walking and boot camp.  Lab Results  Component Value Date   CHOL 219 (H) 05/06/2017   HDL 98 05/06/2017   LDLCALC 109 (H) 05/06/2017   TRIG 59 05/06/2017   CHOLHDL 2.2 05/06/2017   Lab Results  Component Value Date   ALT 18 05/06/2017   AST 20 05/06/2017   ALKPHOS 64 05/06/2017   BILITOT 0.5 05/06/2017  The 10-year ASCVD risk score Mikey Bussing DC Jr., et al., 2013) is: 1.6%   Values used to calculate the score:     Age: 58 years     Sex: Female     Is Non-Hispanic African American: No     Diabetic: No     Tobacco smoker: No     Systolic Blood Pressure: 169 mmHg     Is BP treated: No     HDL Cholesterol: 98 mg/dL     Total Cholesterol: 219 mg/dL  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 No prior positive.   Review of Systems Per HPI.     Objective:   Physical Exam  Constitutional: She is oriented to person, place, and time. She appears well-developed and well-nourished.  HENT:  Head: Normocephalic and atraumatic.  Eyes: Pupils are equal, round, and reactive to light. EOM are  normal.  Neck: Carotid bruit is not present.  Cardiovascular: Normal rate, regular rhythm, normal heart sounds and intact distal pulses.  Pulmonary/Chest: Effort normal and breath sounds normal.  Abdominal: She exhibits no pulsatile midline mass. There is no tenderness.  Neurological: She is alert and oriented to person, place, and time.  Skin: Skin is warm and dry.  Psychiatric: She has a normal mood and affect. Her behavior is normal.  Vitals reviewed.     Assessment & Plan:  Karen Pineda is a 57 y.o. female Hyperlipidemia, unspecified hyperlipidemia type - Plan: Comprehensive metabolic panel, Lipid panel  -Borderline elevated LDL, high HDL.  Low ASCVD risk.  Recheck labs, no new meds at this time.  Continue exercise.  Need for influenza vaccination - Plan: Flu Vaccine QUAD 36+ mos IM  Screening-pulmonary TB - Plan: TB Skin Test   No orders of the defined types were placed in this encounter.  Patient Instructions       If you have lab work done today you will be contacted with your lab results within the next 2 weeks.  If you have not heard from [place] then please contact us. The fastest way to get your results is to register for My Chart.   IF you received an x-ray today,  you will receive an invoice from [facility]. Please contact [facility] at [phone] with questions or concerns regarding your invoice.   IF you received labwork today, you will receive an invoice from [place]. Please contact [facility] at [phone] with questions or concerns regarding your invoice.   Our billing staff will not be able to assist you with questions regarding bills from these companies.  You will be contacted with the lab results as soon as they are available. The fastest way to get your results is to activate your My Chart account. Instructions are located on the last page of this paperwork. If you have not heard from [place] regarding the results in 2 weeks, please contact  this office.      Signed,   Merri Ray, MD [facility] [place].  02/16/18 9:04 AM

## 2018-02-16 NOTE — Progress Notes (Signed)
  Tuberculosis Risk Questionnaire  1. No Were you born outside the [place] in one of the following parts of the world: [place], [place], [place], [place] or [place]?    2. No Have you traveled outside the [place] and lived for more than one month in one of the following parts of the world: [place], [place], [place], [place] or [place]?    3. No Do you have a compromised immune system such as from any of the following conditions:HIV/AIDS, organ or bone marrow transplantation, diabetes, immunosuppressive medicines (e.g. Prednisone, Remicaide), leukemia, lymphoma, cancer of the head or neck, gastrectomy or jejunal bypass, end-stage renal disease (on dialysis), or silicosis?     4. Yes  Have you ever or do you plan on working in: a residential care center, a health care facility, a jail or prison or homeless shelter?    5. No Have you ever: injected illegal drugs, used crack cocaine, lived in a homeless shelter  or been in jail or prison?     6. No Have you ever been exposed to anyone with infectious tuberculosis?  7. No Have you ever had a BCG vaccine? (BCG is a vaccine for tuberculosis  (TB) used in OTHER countries, NOT in the [place]).  8. No Have you ever been advised by a health care provider NOT to have a TB skin test?  9. No Have you ever had a POSITIVE TB skin test?  IF SO, when?   IF SO, were you treated with INH?   IF SO, where? Tuberculosis Symptom Questionnaire  Do you currently have any of the following symptoms?  1. No Unexplained cough lasting more than 3 weeks?   2. No Unexplained fever lasting more than 3 weeks.   3. No Night Sweats (sweating that leaves the bedclothes and sheets wet)     4. No Shortness of Breath   5. No Chest Pain   6. No Unintentional weight loss    7. No Unexplained fatigue (very tired for no reason)

## 2018-02-16 NOTE — Patient Instructions (Addendum)
     If you have lab work done today you will be contacted with your lab results within the next 2 weeks.  If you have not heard from [place] then please contact us. The fastest way to get your results is to register for My Chart. TB SKIN TEST READ ON Wednesday, 02/18/18 2 9:20 AM AT [place].  IF you received an x-ray today, you will receive an invoice from [facility]. Please contact [facility] at [phone] with questions or concerns regarding your invoice.   IF you received labwork today, you will receive an invoice from [facility]. Please contact [facility] at [phone] with questions or concerns regarding your invoice.   Our billing staff will not be able to assist you with questions regarding bills from these companies.  You will be contacted with the lab results as soon as they are available. The fastest way to get your results is to activate your My Chart account. Instructions are located on the last page of this paperwork. If you have not heard from [place] regarding the results in 2 weeks, please contact this office.

## 2018-02-17 LAB — LIPID PANEL
CHOL/HDL RATIO: 2.4 ratio (ref 0.0–4.4)
Cholesterol, Total: 206 mg/dL — ABNORMAL HIGH (ref 100–199)
HDL: 86 mg/dL (ref >39)
LDL Calculated: 106 mg/dL — ABNORMAL HIGH (ref 0–99)
TRIGLYCERIDES: 68 mg/dL (ref 0–149)
VLDL Cholesterol Cal: 14 mg/dL (ref 5–40)

## 2018-02-17 LAB — COMPREHENSIVE METABOLIC PANEL
A/G RATIO: 1.9 (ref 1.2–2.2)
ALBUMIN: 4.5 g/dL (ref 3.5–5.5)
ALT: 16 IU/L (ref 0–32)
AST: 19 IU/L (ref 0–40)
Alkaline Phosphatase: 62 IU/L (ref 39–117)
BUN / CREAT RATIO: 10 (ref 9–23)
BUN: 8 mg/dL (ref 6–24)
Bilirubin Total: 0.6 mg/dL (ref 0.0–1.2)
CALCIUM: 9.6 mg/dL (ref 8.7–10.2)
CO2: 24 mmol/L (ref 20–29)
Chloride: 101 mmol/L (ref 96–106)
Creatinine, Ser: 0.82 mg/dL (ref 0.57–1.00)
GFR calc Af Amer: 91 mL/min/{1.73_m2} (ref >59)
GFR, EST NON AFRICAN AMERICAN: 79 mL/min/{1.73_m2} (ref >59)
GLOBULIN, TOTAL: 2.4 g/dL (ref 1.5–4.5)
Glucose: 93 mg/dL (ref 65–99)
POTASSIUM: 4.6 mmol/L (ref 3.5–5.2)
SODIUM: 140 mmol/L (ref 134–144)
Total Protein: 6.9 g/dL (ref 6.0–8.5)

## 2018-02-18 ENCOUNTER — Ambulatory Visit (INDEPENDENT_AMBULATORY_CARE_PROVIDER_SITE_OTHER): Payer: 59 | Admitting: Family Medicine

## 2018-02-18 DIAGNOSIS — Z23 Encounter for immunization: Secondary | ICD-10-CM

## 2018-02-18 LAB — TB SKIN TEST
Induration: 0 mm
TB Skin Test: NEGATIVE

## 2018-02-18 NOTE — Progress Notes (Signed)
Tb reading only. Pt not seen.

## 2018-03-18 DIAGNOSIS — L905 Scar conditions and fibrosis of skin: Secondary | ICD-10-CM | POA: Diagnosis not present

## 2018-03-18 DIAGNOSIS — D2272 Melanocytic nevi of left lower limb, including hip: Secondary | ICD-10-CM | POA: Diagnosis not present

## 2018-03-18 DIAGNOSIS — L820 Inflamed seborrheic keratosis: Secondary | ICD-10-CM | POA: Diagnosis not present

## 2018-03-18 DIAGNOSIS — L819 Disorder of pigmentation, unspecified: Secondary | ICD-10-CM | POA: Diagnosis not present

## 2018-03-18 DIAGNOSIS — L570 Actinic keratosis: Secondary | ICD-10-CM | POA: Diagnosis not present

## 2018-04-22 DIAGNOSIS — J209 Acute bronchitis, unspecified: Secondary | ICD-10-CM | POA: Diagnosis not present

## 2018-05-02 DIAGNOSIS — R0981 Nasal congestion: Secondary | ICD-10-CM | POA: Diagnosis not present

## 2018-05-02 DIAGNOSIS — J019 Acute sinusitis, unspecified: Secondary | ICD-10-CM | POA: Diagnosis not present

## 2018-09-03 ENCOUNTER — Encounter: Payer: 59 | Admitting: Family Medicine

## 2018-09-04 ENCOUNTER — Encounter: Payer: 59 | Admitting: Family Medicine

## 2018-09-30 ENCOUNTER — Other Ambulatory Visit: Payer: Self-pay

## 2018-09-30 ENCOUNTER — Ambulatory Visit: Payer: 59 | Admitting: Family Medicine

## 2018-09-30 ENCOUNTER — Encounter: Payer: Self-pay | Admitting: Family Medicine

## 2018-09-30 VITALS — BP 115/66 | HR 61 | Temp 98.7°F | Resp 12 | Wt 139.2 lb

## 2018-09-30 DIAGNOSIS — Z0000 Encounter for general adult medical examination without abnormal findings: Secondary | ICD-10-CM

## 2018-09-30 DIAGNOSIS — R002 Palpitations: Secondary | ICD-10-CM

## 2018-09-30 DIAGNOSIS — Z0001 Encounter for general adult medical examination with abnormal findings: Secondary | ICD-10-CM | POA: Diagnosis not present

## 2018-09-30 DIAGNOSIS — Z1329 Encounter for screening for other suspected endocrine disorder: Secondary | ICD-10-CM

## 2018-09-30 DIAGNOSIS — Z8349 Family history of other endocrine, nutritional and metabolic diseases: Secondary | ICD-10-CM

## 2018-09-30 DIAGNOSIS — Z130 Encounter for screening for diseases of the blood and blood-forming organs and certain disorders involving the immune mechanism: Secondary | ICD-10-CM

## 2018-09-30 DIAGNOSIS — E785 Hyperlipidemia, unspecified: Secondary | ICD-10-CM | POA: Diagnosis not present

## 2018-09-30 DIAGNOSIS — Z23 Encounter for immunization: Secondary | ICD-10-CM

## 2018-09-30 LAB — CBC

## 2018-09-30 MED ORDER — SHINGRIX 50 MCG/0.5ML IM SUSR: 0.5000 mL | Freq: Once | INTRAMUSCULAR | 1 refills | Status: AC

## 2018-09-30 NOTE — Patient Instructions (Addendum)
Try cutting back slightly on caffeine, I will check other tests for palpitations. Recheck in next 3-4 weeks and can decide on further workup at that time if needed.  Return to the clinic or go to the nearest emergency room if any of your symptoms worsen or new symptoms occur.  Shingles vaccine sent to pharmacy.   If you would like to meet with pulmonary given your family history, I am happy to refer you but exam is normal today.   Thanks for coming in today.   Palpitations Palpitations are feelings that your heartbeat is irregular or is faster than normal. It may feel like your heart is fluttering or skipping a beat. Palpitations are usually not a serious problem. They may be caused by many things, including smoking, caffeine, alcohol, stress, and certain medicines or drugs. Most causes of palpitations are not serious. However, some palpitations can be a sign of a serious problem. You may need further tests to rule out serious medical problems. Follow these instructions at home:     Pay attention to any changes in your condition. Take these actions to help manage your symptoms: Eating and drinking  Avoid foods and drinks that may cause palpitations. These may include: ? Caffeinated coffee, tea, soft drinks, diet pills, and energy drinks. ? Chocolate. ? Alcohol. Lifestyle  Take steps to reduce your stress and anxiety. Things that can help you relax include: ? Yoga. ? Mind-body activities, such as deep breathing, meditation, or using words and images to create positive thoughts (guided imagery). ? Physical activity, such as swimming, jogging, or walking. Tell your health care provider if your palpitations increase with activity. If you have chest pain or shortness of breath with activity, do not continue the activity until you are seen by your health care provider. ? Biofeedback. This is a method that helps you learn to use your mind to control things in your body, such as your  heartbeat.  Do not use drugs, including cocaine or ecstasy. Do not use marijuana.  Get plenty of rest and sleep. Keep a regular bed time. General instructions  Take over-the-counter and prescription medicines only as told by your health care provider.  Do not use any products that contain nicotine or tobacco, such as cigarettes and e-cigarettes. If you need help quitting, ask your health care provider.  Keep all follow-up visits as told by your health care provider. This is important. These may include visits for further testing if palpitations do not go away or get worse. Contact a health care provider if you:  Continue to have a fast or irregular heartbeat after 24 hours.  Notice that your palpitations occur more often. Get help right away if you:  Have chest pain or shortness of breath.  Have a severe headache.  Feel dizzy or you faint. Summary  Palpitations are feelings that your heartbeat is irregular or is faster than normal. It may feel like your heart is fluttering or skipping a beat.  Palpitations may be caused by many things, including smoking, caffeine, alcohol, stress, certain medicines, and drugs.  Although most causes of palpitations are not serious, some causes can be a sign of a serious medical problem.  Get help right away if you faint or have chest pain, shortness of breath, a severe headache, or dizziness. This information is not intended to replace advice given to you by your health care provider. Make sure you discuss any questions you have with your health care provider. Document Released: 03/22/2000 Document  Revised: 05/07/2017 Document Reviewed: 05/07/2017 Elsevier Interactive Patient Education  2019 [employer]  Get These Tests  Blood Pressure- Have your blood pressure checked by your healthcare provider at least once a year.  Normal blood pressure is 120/80.  Weight- Have your body mass index (BMI) calculated to screen for  obesity.  BMI is a measure of body fat based on height and weight.  You can calculate your own BMI at [employer]  Cholesterol- Have your cholesterol checked every year.  Diabetes- Have your blood sugar checked every year if you have high blood pressure, high cholesterol, a family history of diabetes or if you are overweight.  Pap Test - Have a pap test every 1 to 5 years if you have been sexually active.  If you are older than 65 and recent pap tests have been normal you may not need additional pap tests.  In addition, if you have had a hysterectomy  for benign disease additional pap tests are not necessary.  Mammogram-Yearly mammograms are essential for early detection of breast cancer  Screening for Colon Cancer- Colonoscopy starting at age 29. Screening may begin sooner depending on your family history and other health conditions.  Follow up colonoscopy as directed by your Gastroenterologist.  Screening for Osteoporosis- Screening begins at age 18 with bone density scanning, sooner if you are at higher risk for developing Osteoporosis.  Get these medicines  Calcium with Vitamin D- Your body requires 1200-1500 mg of Calcium a day and [phone] IU of Vitamin D a day.  You can only absorb 500 mg of Calcium at a time therefore Calcium must be taken in 2 or 3 separate doses throughout the day.  Hormones- Hormone therapy has been associated with increased risk for certain cancers and heart disease.  Talk to your healthcare provider about if you need relief from menopausal symptoms.  Aspirin- Ask your healthcare provider about taking Aspirin to prevent Heart Disease and Stroke.  Get these Immuniztions  Flu shot- Every fall  Pneumonia shot- Once after the age of 37; if you are younger ask your healthcare provider if you need a pneumonia shot.  Tetanus- Every ten years.  Zostavax- Once after the age of 45 to prevent shingles.  Take these steps  Don't smoke- Your healthcare  provider can help you quit. For tips on how to quit, ask your healthcare provider or go to www.smokefree.gov or call 1-800 QUIT-NOW.  Be physically active- Exercise 5 days a week for a minimum of 30 minutes.  If you are not already physically active, start slow and gradually work up to 30 minutes of moderate physical activity.  Try walking, dancing, bike riding, swimming, etc.  Eat a healthy diet- Eat a variety of healthy foods such as fruits, vegetables, whole grains, low fat milk, low fat cheeses, yogurt, lean meats, chicken, fish, eggs, dried beans, tofu, etc.  For more information go to www.thenutritionsource.org  Dental visit- Brush and floss teeth twice daily; visit your dentist twice a year.  Eye exam- Visit your Optometrist or Ophthalmologist yearly.  Drink alcohol in moderation- Limit alcohol intake to one drink or less a day.  Never drink and drive.  Depression- Your emotional health is as important as your physical health.  If you're feeling down or losing interest in things you normally enjoy, please talk to your healthcare provider.  Seat Belts- can save your life; always wear one  Smoke/Carbon Monoxide detectors- These detectors need to be installed on the appropriate  level of your home.  Replace batteries at least once a year.  Violence- If anyone is threatening or hurting you, please tell your healthcare provider.  Living Will/ Health care power of attorney- Discuss with your healthcare provider and family.   If you have lab work done today you will be contacted with your lab results within the next 2 weeks.  If you have not heard from [place] then please contact us. The fastest way to get your results is to register for My Chart.   IF you received an x-ray today, you will receive an invoice from [facility]. Please contact [facility] at [phone] with questions or concerns regarding your invoice.   IF you received labwork today, you will receive an  invoice from [facility]. Please contact [facility] at [phone] with questions or concerns regarding your invoice.   Our billing staff will not be able to assist you with questions regarding bills from these companies.  You will be contacted with the lab results as soon as they are available. The fastest way to get your results is to activate your My Chart account. Instructions are located on the last page of this paperwork. If you have not heard from [place] regarding the results in 2 weeks, please contact this office.

## 2018-09-30 NOTE — Progress Notes (Signed)
Subjective:    Patient ID: Karen Pineda, female    DOB: 11-Jun-1960, 58 y.o.   MRN: [id number]  HPI Karen Pineda is a 58 y.o. female Presents today for: Chief Complaint  Patient presents with  . Annual Exam    Patient is doing well not have any issus at this time   Heartburn: When discussed last year had switch from Zantac to famotidine, taking daily which is controlling symptoms.  Still on same dose - controlled.   Situational anxiety: Rare use of Xanax in the past, had minimal use after initially needed after her father's passing. 1/4-1/2 per week for anxiety or sleep.   Degenerative disc disease of cervical and lumbar  spine  Rare tylenol or ibuprofen use at last visit which was managing symptoms well. Still doing ok - controlled with otc meds - 1 tylenol per week usually.   Palpitations: Noted when walking in morning, or in bed at night at times. Last felt 3 weeks ago, initially 2 months ago. Off and on. Feels like heart skips a beat or "flip sensation" Occurred 1-2 times per week. Not anxious at the time. No chest pain/syncope/near syncope.  Caffeine: 2 cups coffee per day. Some tea at times now.  Cold natured.  Normal tsh and CBC last year. No new cough/dyspnea. Mom with Sjogrens, pulmonary fibrosis. No personal symptoms.    Hyperlipidemia:  Lab Results  Component Value Date   CHOL 206 (H) 02/16/2018   HDL 86 02/16/2018   LDLCALC 106 (H) 02/16/2018   TRIG 68 02/16/2018   CHOLHDL 2.4 02/16/2018   Lab Results  Component Value Date   ALT 16 02/16/2018   AST 19 02/16/2018   ALKPHOS 62 02/16/2018   BILITOT 0.6 02/16/2018  Mild elevation previously, low 10-year ASCVD risk or 1.6% in November of last year.  Cancer screening: Colonoscopy February 20, 2014, repeat 10 years Mammogram July 2019 with OB/GYN, Dr. Nori Riis.  Family history of breast cancer, 21% lifetime risk -bilateral breast MRI in August 2019 concerns.  Plan for annual screening mammogram and MRI.  Pap test April 2019, and had one 2 days ago as well.  Derm once per year - Dr. Ubaldo Glassing.   Immunization History  Administered Date(s) Administered  . Influenza,inj,Quad PF,6+ Mos 02/16/2018  . PPD Test 02/16/2018  . Tdap 08/18/2017  Shingles: has not had.   Depression screen [facility] 2/9 09/30/2018 02/16/2018 08/18/2017 05/06/2017  Decreased Interest 0 0 0 0  Down, Depressed, Hopeless 0 0 0 0  PHQ - 2 Score 0 0 0 0    Hearing Screening   125Hz  250Hz  500Hz  1000Hz  2000Hz  3000Hz  4000Hz  6000Hz  8000Hz   Right ear:           Left ear:             Visual Acuity Screening   Right eye Left eye Both eyes  Without correction:     With correction: 20/20 20/20 20/20   optho once per year. Bifocal contacts.   Dental: every 6 months, sees son who is a Pharmacist, community.   Exercise: intermittent - home activity.   All rhinitis- controlled with otc zyrtec, flonase.   Patient Active Problem List   Diagnosis Date Noted  . DDD (degenerative disc disease), lumbar 08/18/2017  . Situational anxiety 08/18/2017  . Ankle pain 12/11/2011   Past Medical History:  Diagnosis Date  . Allergy    seasonal  . Anxiety    occasional  . GERD (gastroesophageal reflux disease)  occasional  . Thyroid disease    Past Surgical History:  Procedure Laterality Date  . ABDOMINAL HYSTERECTOMY     No Known Allergies Prior to Admission medications   Medication Sig Start Date End Date Taking? Authorizing Provider  ALPRAZolam (XANAX) 0.25 MG tablet Take 0.125 mg by mouth daily as needed for anxiety.    Yes [provider]  Calcium Carbonate-Vitamin D (CALTRATE 600+D) 600-400 MG-UNIT per tablet Take 1 tablet by mouth daily.   Yes [provider]  cholecalciferol (VITAMIN D) 1000 UNITS tablet Take 2,000 Units by mouth daily.   Yes [provider]  estradiol (VIVELLE-DOT) 0.1 MG/24HR patch Vivelle-Dot 0.1 mg/24 hr transdermal patch   Yes [provider]  famotidine (PEPCID) 10 MG tablet Take 10  mg by mouth 2 (two) times daily.   Yes [provider]  ibuprofen (ADVIL,MOTRIN) 200 MG tablet Take 200 mg by mouth every 6 (six) hours as needed.    Yes [provider]  Multiple Vitamins-Minerals (CENTRUM SILVER PO) Take 1 tablet by mouth daily.   Yes [provider]   Social History   Socioeconomic History  . Marital status: Married    Spouse name: Not on file  . Number of children: Not on file  . Years of education: Not on file  . Highest education level: Not on file  Occupational History  . Not on file  Social Needs  . Financial resource strain: Not on file  . Food insecurity    Worry: Not on file    Inability: Not on file  . Transportation needs    Medical: Not on file    Non-medical: Not on file  Tobacco Use  . Smoking status: Never Smoker  . Smokeless tobacco: Never Used  Substance and Sexual Activity  . Alcohol use: Not on file  . Drug use: Not on file  . Sexual activity: Not on file  Lifestyle  . Physical activity    Days per week: Not on file    Minutes per session: Not on file  . Stress: Not on file  Relationships  . Social Herbalist on phone: Not on file    Gets together: Not on file    Attends religious service: Not on file    Active member of club or organization: Not on file    Attends meetings of clubs or organizations: Not on file    Relationship status: Not on file  . Intimate partner violence    Fear of current or ex partner: Not on file    Emotionally abused: Not on file    Physically abused: Not on file    Forced sexual activity: Not on file  Other Topics Concern  . Not on file  Social History Narrative  . Not on file    Review of Systems     Objective:   Physical Exam Constitutional:      Appearance: She is well-developed.  HENT:     Head: Normocephalic and atraumatic.     Right Ear: External ear normal.     Left Ear: External ear normal.  Eyes:     Conjunctiva/sclera: Conjunctivae normal.      Pupils: Pupils are equal, round, and reactive to light.  Neck:     Musculoskeletal: Normal range of motion and neck supple.     Thyroid: No thyromegaly.  Cardiovascular:     Rate and Rhythm: Normal rate and regular rhythm.     Heart sounds: Normal  heart sounds. No murmur.  Pulmonary:     Effort: Pulmonary effort is normal. No respiratory distress.     Breath sounds: Normal breath sounds. No wheezing.  Abdominal:     General: Bowel sounds are normal.     Palpations: Abdomen is soft.     Tenderness: There is no abdominal tenderness.  Musculoskeletal: Normal range of motion.        General: No tenderness.  Lymphadenopathy:     Cervical: No cervical adenopathy.  Skin:    General: Skin is warm and dry.     Findings: No rash.  Neurological:     Mental Status: She is alert and oriented to person, place, and time.  Psychiatric:        Behavior: Behavior normal.        Thought Content: Thought content normal.    Vitals:   09/30/18 0809  BP: 115/66  Pulse: 61  Resp: 12  Temp: 98.7 F (37.1 C)  TempSrc: Oral  SpO2: 97%  Weight: 139 lb 3.2 oz (63.1 kg)    EKG: sinus brady cardia, rate 56, RSR in V1, left atrial enlargement. No prior available for review.    Assessment & Plan:   LANESHA AZZARO is a 58 y.o. female Annual physical exam  - -anticipatory guidance as below in AVS, screening labs above. Health maintenance items as above in HPI discussed/recommended as applicable.   Hyperlipidemia, unspecified hyperlipidemia type - Plan: Lipid Panel, Comprehensive metabolic panel  -min elevation prior. Repeat testing.   Family history of thyroid disease Screening for thyroid disorder - Plan: TSH Palpitations - Plan: TSH, CBC, EKG 12-Lead  -Check labs, decrease caffeine, other handout given on palpitations and cause.  Possible PVCs, infrequent symptoms.  Reassuring exam at present.  Recheck in the next 1 month, sooner if increased frequency.  Consider cardiology eval if persistent   Need for shingles vaccine - Plan: Zoster Vaccine Adjuvanted [facility]) injection sent to pharmacy.   Screening, anemia, deficiency, iron - Plan: CBC   Meds ordered this encounter  Medications  . Zoster Vaccine Adjuvanted [facility]) injection    Sig: Inject 0.5 mLs into the muscle once for 1 dose. Repeat in 2-6 months.    Dispense:  0.5 mL    Refill:  1   Patient Instructions    Try cutting back slightly on caffeine, I will check other tests for palpitations. Recheck in next 3-4 weeks and can decide on further workup at that time if needed.  Return to the clinic or go to the nearest emergency room if any of your symptoms worsen or new symptoms occur.  Shingles vaccine sent to pharmacy.   If you would like to meet with pulmonary given your family history, I am happy to refer you but exam is normal today.   Thanks for coming in today.   Palpitations Palpitations are feelings that your heartbeat is irregular or is faster than normal. It may feel like your heart is fluttering or skipping a beat. Palpitations are usually not a serious problem. They may be caused by many things, including smoking, caffeine, alcohol, stress, and certain medicines or drugs. Most causes of palpitations are not serious. However, some palpitations can be a sign of a serious problem. You may need further tests to rule out serious medical problems. Follow these instructions at home:     Pay attention to any changes in your condition. Take these actions to help manage your symptoms: Eating and drinking  Avoid foods and  drinks that may cause palpitations. These may include: ? Caffeinated coffee, tea, soft drinks, diet pills, and energy drinks. ? Chocolate. ? Alcohol. Lifestyle  Take steps to reduce your stress and anxiety. Things that can help you relax include: ? Yoga. ? Mind-body activities, such as deep breathing, meditation, or using words and images to create positive thoughts (guided imagery). ?  Physical activity, such as swimming, jogging, or walking. Tell your health care provider if your palpitations increase with activity. If you have chest pain or shortness of breath with activity, do not continue the activity until you are seen by your health care provider. ? Biofeedback. This is a method that helps you learn to use your mind to control things in your body, such as your heartbeat.  Do not use drugs, including cocaine or ecstasy. Do not use marijuana.  Get plenty of rest and sleep. Keep a regular bed time. General instructions  Take over-the-counter and prescription medicines only as told by your health care provider.  Do not use any products that contain nicotine or tobacco, such as cigarettes and e-cigarettes. If you need help quitting, ask your health care provider.  Keep all follow-up visits as told by your health care provider. This is important. These may include visits for further testing if palpitations do not go away or get worse. Contact a health care provider if you:  Continue to have a fast or irregular heartbeat after 24 hours.  Notice that your palpitations occur more often. Get help right away if you:  Have chest pain or shortness of breath.  Have a severe headache.  Feel dizzy or you faint. Summary  Palpitations are feelings that your heartbeat is irregular or is faster than normal. It may feel like your heart is fluttering or skipping a beat.  Palpitations may be caused by many things, including smoking, caffeine, alcohol, stress, certain medicines, and drugs.  Although most causes of palpitations are not serious, some causes can be a sign of a serious medical problem.  Get help right away if you faint or have chest pain, shortness of breath, a severe headache, or dizziness. This information is not intended to replace advice given to you by your health care provider. Make sure you discuss any questions you have with your health care provider. Document  Released: 03/22/2000 Document Revised: 05/07/2017 Document Reviewed: 05/07/2017 Elsevier Interactive Patient Education  2019 [employer]  Get These Tests  Blood Pressure- Have your blood pressure checked by your healthcare provider at least once a year.  Normal blood pressure is 120/80.  Weight- Have your body mass index (BMI) calculated to screen for obesity.  BMI is a measure of body fat based on height and weight.  You can calculate your own BMI at [employer]  Cholesterol- Have your cholesterol checked every year.  Diabetes- Have your blood sugar checked every year if you have high blood pressure, high cholesterol, a family history of diabetes or if you are overweight.  Pap Test - Have a pap test every 1 to 5 years if you have been sexually active.  If you are older than 65 and recent pap tests have been normal you may not need additional pap tests.  In addition, if you have had a hysterectomy  for benign disease additional pap tests are not necessary.  Mammogram-Yearly mammograms are essential for early detection of breast cancer  Screening for Colon Cancer- Colonoscopy starting at age 50. Screening may begin sooner depending on your  family history and other health conditions.  Follow up colonoscopy as directed by your Gastroenterologist.  Screening for Osteoporosis- Screening begins at age 45 with bone density scanning, sooner if you are at higher risk for developing Osteoporosis.  Get these medicines  Calcium with Vitamin D- Your body requires 1200-1500 mg of Calcium a day and [phone] IU of Vitamin D a day.  You can only absorb 500 mg of Calcium at a time therefore Calcium must be taken in 2 or 3 separate doses throughout the day.  Hormones- Hormone therapy has been associated with increased risk for certain cancers and heart disease.  Talk to your healthcare provider about if you need relief from menopausal symptoms.  Aspirin- Ask your  healthcare provider about taking Aspirin to prevent Heart Disease and Stroke.  Get these Immuniztions  Flu shot- Every fall  Pneumonia shot- Once after the age of 80; if you are younger ask your healthcare provider if you need a pneumonia shot.  Tetanus- Every ten years.  Zostavax- Once after the age of 36 to prevent shingles.  Take these steps  Don't smoke- Your healthcare provider can help you quit. For tips on how to quit, ask your healthcare provider or go to www.smokefree.gov or call 1-800 QUIT-NOW.  Be physically active- Exercise 5 days a week for a minimum of 30 minutes.  If you are not already physically active, start slow and gradually work up to 30 minutes of moderate physical activity.  Try walking, dancing, bike riding, swimming, etc.  Eat a healthy diet- Eat a variety of healthy foods such as fruits, vegetables, whole grains, low fat milk, low fat cheeses, yogurt, lean meats, chicken, fish, eggs, dried beans, tofu, etc.  For more information go to www.thenutritionsource.org  Dental visit- Brush and floss teeth twice daily; visit your dentist twice a year.  Eye exam- Visit your Optometrist or Ophthalmologist yearly.  Drink alcohol in moderation- Limit alcohol intake to one drink or less a day.  Never drink and drive.  Depression- Your emotional health is as important as your physical health.  If you're feeling down or losing interest in things you normally enjoy, please talk to your healthcare provider.  Seat Belts- can save your life; always wear one  Smoke/Carbon Monoxide detectors- These detectors need to be installed on the appropriate level of your home.  Replace batteries at least once a year.  Violence- If anyone is threatening or hurting you, please tell your healthcare provider.  Living Will/ Health care power of attorney- Discuss with your healthcare provider and family.   If you have lab work done today you will be contacted with your lab results within the  next 2 weeks.  If you have not heard from [place] then please contact us. The fastest way to get your results is to register for My Chart.   IF you received an x-ray today, you will receive an invoice from [facility]. Please contact [facility] at [phone] with questions or concerns regarding your invoice.   IF you received labwork today, you will receive an invoice from [place]. Please contact [facility] at [phone] with questions or concerns regarding your invoice.   Our billing staff will not be able to assist you with questions regarding bills from these companies.  You will be contacted with the lab results as soon as they are available. The fastest way to get your results is to activate your My Chart account. Instructions are located on the last page of this paperwork.  If you have not heard from [place] regarding the results in 2 weeks, please contact this office.       Signed,   Merri Ray, MD [facility] [place].  09/30/18 8:53 AM

## 2018-10-01 LAB — LIPID PANEL
Chol/HDL Ratio: 2.2 ratio (ref 0.0–4.4)
Cholesterol, Total: 215 mg/dL — ABNORMAL HIGH (ref 100–199)
HDL: 97 mg/dL (ref >39)
LDL Calculated: 106 mg/dL — ABNORMAL HIGH (ref 0–99)
Triglycerides: 62 mg/dL (ref 0–149)
VLDL Cholesterol Cal: 12 mg/dL (ref 5–40)

## 2018-10-01 LAB — COMPREHENSIVE METABOLIC PANEL
ALT: 17 IU/L (ref 0–32)
AST: 23 IU/L (ref 0–40)
Albumin/Globulin Ratio: 2 (ref 1.2–2.2)
Albumin: 4.5 g/dL (ref 3.8–4.9)
Alkaline Phosphatase: 60 IU/L (ref 39–117)
BUN/Creatinine Ratio: 10 — ABNORMAL LOW (ref 12–28)
BUN: 8 mg/dL (ref 8–27)
Bilirubin Total: 0.5 mg/dL (ref 0.0–1.2)
CO2: 22 mmol/L (ref 20–29)
Calcium: 9.4 mg/dL (ref 8.7–10.3)
Chloride: 103 mmol/L (ref 96–106)
Creatinine, Ser: 0.78 mg/dL (ref 0.57–1.00)
GFR calc Af Amer: 96 mL/min/{1.73_m2} (ref >59)
GFR calc non Af Amer: 83 mL/min/{1.73_m2} (ref >59)
Globulin, Total: 2.3 g/dL (ref 1.5–4.5)
Glucose: 99 mg/dL (ref 65–99)
Potassium: 4.2 mmol/L (ref 3.5–5.2)
Sodium: 139 mmol/L (ref 134–144)
Total Protein: 6.8 g/dL (ref 6.0–8.5)

## 2018-10-01 LAB — CBC
Hematocrit: 39.6 % (ref 34.0–46.6)
Hemoglobin: 13.3 g/dL (ref 11.1–15.9)
MCH: 30 pg (ref 26.6–33.0)
MCHC: 33.6 g/dL (ref 31.5–35.7)
MCV: 89 fL (ref 79–97)
Platelets: 274 10*3/uL (ref 150–450)
RBC: 4.44 x10E6/uL (ref 3.77–5.28)
RDW: 12.8 % (ref 11.7–15.4)
WBC: 7 10*3/uL (ref 3.4–10.8)

## 2018-10-01 LAB — TSH: TSH: 2.14 u[IU]/mL (ref 0.450–4.500)

## 2018-10-07 ENCOUNTER — Other Ambulatory Visit: Payer: Self-pay | Admitting: Obstetrics & Gynecology

## 2018-10-07 DIAGNOSIS — Z1231 Encounter for screening mammogram for malignant neoplasm of breast: Secondary | ICD-10-CM

## 2018-11-02 ENCOUNTER — Ambulatory Visit (INDEPENDENT_AMBULATORY_CARE_PROVIDER_SITE_OTHER): Payer: 59 | Admitting: Family Medicine

## 2018-11-02 ENCOUNTER — Encounter: Payer: Self-pay | Admitting: Family Medicine

## 2018-11-02 ENCOUNTER — Other Ambulatory Visit: Payer: Self-pay

## 2018-11-02 VITALS — BP 112/66 | HR 65 | Temp 97.7°F | Resp 14 | Wt 142.0 lb

## 2018-11-02 DIAGNOSIS — R82998 Other abnormal findings in urine: Secondary | ICD-10-CM

## 2018-11-02 DIAGNOSIS — R209 Unspecified disturbances of skin sensation: Secondary | ICD-10-CM

## 2018-11-02 DIAGNOSIS — R002 Palpitations: Secondary | ICD-10-CM | POA: Diagnosis not present

## 2018-11-02 NOTE — Patient Instructions (Addendum)
   If palpitations are improving, ok to monitor for changes, but I can refer you to cardiology at any point to discuss this further and review your prior EKG.   Sensation in foot could be due to nerve issue, potentially from back. Walking/jogging or low intensity exercise for now, but if not improving in next few weeks (or worse sooner) - follow up to discuss further.   Make sure to drink sufficient water, but if persistent odor in urine - return to discuss further and other testing.   Return to the clinic or go to the nearest emergency room if any of your symptoms worsen or new symptoms occur.    If you have lab work done today you will be contacted with your lab results within the next 2 weeks.  If you have not heard from [place] then please contact us. The fastest way to get your results is to register for My Chart.   IF you received an x-ray today, you will receive an invoice from [facility]. Please contact [facility]iology at [phone] with questions or concerns regarding your invoice.   IF you received labwork today, you will receive an invoice from [place]. Please contact [facility] at [phone] with questions or concerns regarding your invoice.   Our billing staff will not be able to assist you with questions regarding bills from these companies.  You will be contacted with the lab results as soon as they are available. The fastest way to get your results is to activate your My Chart account. Instructions are located on the last page of this paperwork. If you have not heard from [place] regarding the results in 2 weeks, please contact this office.

## 2018-11-02 NOTE — Progress Notes (Signed)
Subjective:    Patient ID: Karen Pineda, female    DOB: [birth year], 58 y.o.   MRN: [id number]  HPI Karen Pineda is a 58 y.o. female Presents today for: Chief Complaint  Patient presents with  . Follow-up    f/u on labs and ekg that was done on 09/30/18  . Foot Pain    rt foot vibration since 10/01/2018.    Palpitations: Discussed at her physical June 24.  Recommended cutting back on caffeine at that time.  EKG was sinus bradycardia of rate 56. Lipid screening performed June 24, low 10-year ASCVD risk, diet approach with recheck in 6 months to a year normal CBC, TSH.  Normal CMP. Feels better., did decrease caffeine intake.   Right foot sensation: Vibration sensation in bottom of right foot. looked up - pallasthesias.  Notices when quiet/still. 3-4 episodes. Last felt yesterday.  Noticed past weekend only.  Standing up or lying down.  Takes B complex and vit D otc.  Occasional shooting pain down legs in past from back, but not much of an issue lately. Did pick up [place] cabinet last week.  Occasional sweet smell to urine, off and on for years.  Concerned about glucose - father and brother with blood sugar issues.  ? Stress issue vs. Anemia vs peripheral neuropathy.  Less stressed recently. Cohorts' dtr terminally ill.  Feel like less exercise recently - managed stress better in past.    Review of Systems  Constitutional: Negative for fever.  Cardiovascular: Positive for palpitations (rare).  Genitourinary: Negative for decreased urine volume, difficulty urinating, dysuria, frequency, urgency and vaginal discharge.   Per HPI.     Objective:   Physical Exam Constitutional:      General: She is not in acute distress.    Appearance: She is well-developed.  HENT:     Head: Normocephalic and atraumatic.  Cardiovascular:     Rate and Rhythm: Normal rate and regular rhythm.     Heart sounds: Normal heart sounds.  Pulmonary:     Effort: Pulmonary effort is normal.   Abdominal:     Tenderness: There is no abdominal tenderness. There is no right CVA tenderness or left CVA tenderness.  Musculoskeletal:     Right foot: Normal. Normal range of motion. No tenderness (locates area of vibration sense to the 1st-2nd metatarsal heads, but no tenderness, negative lateral squeeze.), bony tenderness, swelling or deformity.  Skin:    General: Skin is warm and dry.     Findings: No rash.  Neurological:     General: No focal deficit present.     Mental Status: She is alert and oriented to person, place, and time.     Sensory: No sensory deficit.    Vitals:   11/02/18 0808  BP: 112/66  Pulse: 65  Resp: 14  Temp: 97.7 F (36.5 C)  TempSrc: Oral  SpO2: 97%  Weight: 142 lb (64.4 kg)       Assessment & Plan:  [name] is a 58 y.o. female Palpitations  -Improved.  Possible caffeine/stress component previously.  Restart low intensity exercise, option to meet with cardiology to discuss EKG, deferred at this time.  -RTC precautions if increased frequency of symptoms or worsening  Altered sensation, foot  -Vibration sense, intermittent.  Recent onset, may be related to lumbar issues and recent heavy lifting.  Does not appear to be Morton's neuroma as nonpainful with lateral squeeze, unless very early symptoms.  Unlikely nutritional deficiency as unilateral,  recent CBC, TSH, CMP were reassuring.    -If persistent, consider B12 magnesium testing, RTC precautions given  Sweet urine odor  -Intermittent.  May be associated with certain foods.  Glucose normal on recent lab work.  RTC precautions if persistent  No orders of the defined types were placed in this encounter.  Patient Instructions     If palpitations are improving, ok to monitor for changes, but I can refer you to cardiology at any point to discuss this further and review your prior EKG.   Sensation in foot could be due to nerve issue, potentially from back. Walking/jogging or low intensity  exercise for now, but if not improving in next few weeks (or worse sooner) - follow up to discuss further.   Make sure to drink sufficient water, but if persistent odor in urine - return to discuss further and other testing.   Return to the clinic or go to the nearest emergency room if any of your symptoms worsen or new symptoms occur.    If you have lab work done today you will be contacted with your lab results within the next 2 weeks.  If you have not heard from [place] then please contact us. The fastest way to get your results is to register for My Chart.   IF you received an x-ray today, you will receive an invoice from [facility]. Please contact [facility] Radiology at [phone] with questions or concerns regarding your invoice.   IF you received labwork today, you will receive an invoice from [place]. Please contact [facility] at [phone] with questions or concerns regarding your invoice.   Our billing staff will not be able to assist you with questions regarding bills from these companies.  You will be contacted with the lab results as soon as they are available. The fastest way to get your results is to activate your My Chart account. Instructions are located on the last page of this paperwork. If you have not heard from [place] regarding the results in 2 weeks, please contact this office.       Signed,   Merri Ray, MD [facility] [place].  11/02/18 8:46 AM

## 2018-11-24 ENCOUNTER — Ambulatory Visit
Admission: RE | Admit: 2018-11-24 | Discharge: 2018-11-24 | Disposition: A | Discharge status: Home / Self Care | Payer: 59 | Source: Ambulatory Visit | Attending: Obstetrics & Gynecology | Admitting: Obstetrics & Gynecology

## 2018-11-24 ENCOUNTER — Other Ambulatory Visit: Payer: Self-pay

## 2018-11-24 DIAGNOSIS — Z1231 Encounter for screening mammogram for malignant neoplasm of breast: Secondary | ICD-10-CM

## 2018-11-26 ENCOUNTER — Ambulatory Visit (INDEPENDENT_AMBULATORY_CARE_PROVIDER_SITE_OTHER): Payer: 59 | Admitting: Sports Medicine

## 2018-11-26 ENCOUNTER — Ambulatory Visit: Payer: Self-pay

## 2018-11-26 ENCOUNTER — Other Ambulatory Visit: Payer: Self-pay

## 2018-11-26 ENCOUNTER — Encounter: Payer: Self-pay | Admitting: Sports Medicine

## 2018-11-26 VITALS — BP 120/70 | Ht 64.0 in | Wt 140.0 lb

## 2018-11-26 DIAGNOSIS — M25562 Pain in left knee: Secondary | ICD-10-CM | POA: Diagnosis not present

## 2018-11-26 DIAGNOSIS — S83282A Other tear of lateral meniscus, current injury, left knee, initial encounter: Secondary | ICD-10-CM | POA: Diagnosis not present

## 2018-11-26 NOTE — Patient Instructions (Signed)
It was great to see you today!  Do quad exercises we gave you- if you do them daily, they will strengthen your quad and stabilize your knee.  Wear compression sleeve with activity. Ice after any activity or with pain. Do not walk for 2 days in a row as this can aggravate knee. You may bike or swim as much as you would like-- these should not hurt your knee. If your stomach pain is increasing, cut back on ibuprofen.  Call us if this is getting worse. Otherwise, we will see you in 3 weeks!

## 2018-11-26 NOTE — Progress Notes (Signed)
Karen Pineda - 58 y.o. female MRN [id number]  Date of birth: November 28, 1960  SUBJECTIVE:   CC: left knee pain   58 yo female presenting with left knee pain for the past 10 days. She said that last week, she ran for 3 consecutive days in a row as it was cooler outside. At baseline, she runs intermittently and had not run in months. She started to have pain in the front of her knee, then the pain moved to behind her knee. Pain is worse going up and down stairs. She has been very careful and has been moving slowly, trying to avoid putting too much weight on her right leg. She rested and iced knee for several days and overall it felt better, but then yesterday she started to have sharp pain on the outside of her knee. Has not noticed swelling. She said that in general, her knee locks and gives out on occasion.  She has taken NSAIDs about 4x daily but has noticed that its been bothering her stomach.  ROS: No unexpected weight loss, fever, chills, swelling, instability, muscle pain, numbness/tingling, redness, otherwise see HPI   PMHx - Updated and reviewed.  Contributory factors include: Negative PSHx - Updated and reviewed.  Contributory factors include:  Negative FHx - Updated and reviewed.  Contributory factors include:  Negative Social Hx - Updated and reviewed. Contributory factors include: Negative Medications - reviewed   DATA REVIEWED: none  PHYSICAL EXAM:  VS: BP:    HR: bpm   TEMP: ( )   RESP:    HT:     WT:    BMI:  PHYSICAL EXAM: Gen: NAD, alert, cooperative with exam, well-appearing HEENT: clear conjunctiva,  CV:  no edema, capillary refill brisk, normal rate Resp: non-labored Skin: no rashes, normal turgor  Neuro: no gross deficits.  Psych:  alert and oriented  Left Knee: - Inspection: no gross deformity. No swelling/effusion, although left knee appears larger than right. erythema or bruising. Skin intact - Palpation: TTP along lateral joint line - ROM: full active ROM with  flexion and extension in knee and hip - Strength: 5/5 strength - Neuro/vasc: NV intact - Special Tests: - LIGAMENTS: negative anterior and posterior drawer, negative Lachman's, no MCL or LCL laxity  -- MENISCUS: negative McMurray's, positive Thessaly  -- PF JOINT: nml patellar mobility bilaterally.  negative patellar grind Positive ober's sign  Right knee: - Inspection: no gross deformity. No swelling/effusion, erythema or bruising. Skin intact - Palpation: no TTP - ROM: full active ROM with flexion and extension in knee and hip - Strength: 5/5 strength - Neuro/vasc: NV intact  Hips: normal ROM, negative FABER and FADIR bilaterally  Limited ultrasound, left knee: Suprapatellar pouch with effusion noted. Lateral meniscus with split tear, hypoechoic fluid noted superior to meniscus.  Impression: Joint effusion secondary to lateral meniscal split tear    ASSESSMENT & PLAN:  58 yo female presenting with left knee pain with effusion and split meniscal tear visualized on ultrasound. Meniscal tear is likely chronic and was aggravated by consecutive days of running. Will proceed with conservative management with isometric quadriceps strengthening exercises, compression, and icing as needed. Recommended avoiding consecutive days of walking or high impact exercise, instead doing biking or swimming. Discussed limiting NSAIDs given stomach irritation. Will follow up in 3 weeks. If no improvement, will consider corticosteroid injection and further imaging.   Patient seen and evaluated with the sports medicine fellow.  I agree with the above plan of care.  Patient's  ultrasound shows findings consistent with probable split tear of the lateral meniscus with accompanying joint effusion.  Proceed with treatment as above and follow-up in 3 weeks.  If symptoms persist or worsen consider further diagnostic imaging or cortisone injection.  Patient will call with questions or concerns prior to follow-up.

## 2018-12-17 ENCOUNTER — Ambulatory Visit: Payer: 59 | Admitting: Sports Medicine

## 2018-12-22 ENCOUNTER — Ambulatory Visit: Payer: 59 | Admitting: Sports Medicine

## 2018-12-22 ENCOUNTER — Encounter: Payer: Self-pay | Admitting: Sports Medicine

## 2018-12-22 ENCOUNTER — Other Ambulatory Visit: Payer: Self-pay

## 2018-12-22 ENCOUNTER — Ambulatory Visit
Admission: RE | Admit: 2018-12-22 | Discharge: 2018-12-22 | Disposition: A | Discharge status: Home / Self Care | Payer: 59 | Source: Ambulatory Visit | Attending: Sports Medicine | Admitting: Sports Medicine

## 2018-12-22 VITALS — BP 118/78 | Ht 63.5 in | Wt 140.0 lb

## 2018-12-22 DIAGNOSIS — M25562 Pain in left knee: Secondary | ICD-10-CM

## 2018-12-22 NOTE — Progress Notes (Signed)
   [facility] [address] [place], [place] [address] Phone: [phone] Fax: [phone]   Patient Name: Karen Pineda Date of Birth: 1960/06/04 Medical Record Number: [id number] Gender: female Date of Encounter: 12/22/2018  SUBJECTIVE:      Chief Complaint:  Follow-up left knee pain   HPI:  Karen Pineda presents today for follow-up of left knee pain.  Overall she notices much improvement since last visit.  She has been walking regularly and doing a home exercise strengthening program.  She only notices pain with any cutting or quick turning maneuvers.  She is using ice and ibuprofen as needed.  She denies any swelling weakness, instability, erythema, or skin changes.  She was even able to tolerate a little bit pain-free.  She is going to swim today for the first time.     ROS:     See HPI.   PERTINENT  PMH / PSH / FH / SH:  Past Medical, Surgical, Social, and Family History Reviewed & Updated in the EMR.    OBJECTIVE:  BP 118/78   Ht 5' 3.5" (1.613 m)   Wt 140 lb (63.5 kg)   BMI 24.41 kg/m  Physical Exam:  Vital signs are reviewed.   GEN: Alert and oriented, NAD Pulm: Breathing unlabored PSY: normal mood, congruent affect  MSK: Left knee: Normal to inspection with no erythema or effusion or obvious bony abnormalities. Palpation normal with no warmth, joint line tenderness, patellar tenderness, or condyle tenderness. ROM full in flexion and extension and lower leg rotation.  Decreased patella mobility. Ligaments with solid consistent endpoints including ACL, PCL, LCL, MCL. Negative Mcmurray's and Thessaly tests. Non painful patellar compression. Patellar glide without crepitus. Patellar and quadriceps tendons unremarkable. Hamstring and quadriceps strength is normal.  Neurovascularly intact.   ASSESSMENT & PLAN:   1. Left knee pain   Given that patient has had improvement with conservative therapy, we will continue to transtarsal  treatment.  We will obtain a standing weightbearing knee x-ray to assess for osteoarthritis.  He has been asked to ultrasound shows possible meniscal pathology, the x-ray will help [place] rule out how much disease contributing from arthritis.  Discussed a gradual return to running protocol, and letting pain be her guide.  Is okay to use NSAIDs and ice as needed.  She will follow-up as needed at which time we can consider a CSI versus MRI.   Lanier Clam, DO, ATC Sports Medicine Fellow  Patient seen and evaluated with the sports medicine fellow.  I agree with the above plan of care.  X-rays show some mild medial joint space narrowing but otherwise unremarkable.  Patient is improving with our current treatment plan.  She may start back with some limited running and increase by 10 %/week based on symptoms.  If symptoms return then consider merits of a cortisone injection versus an MRI.  She will follow-up for ongoing or recalcitrant issues.

## 2019-03-02 ENCOUNTER — Other Ambulatory Visit: Payer: Self-pay

## 2019-03-02 DIAGNOSIS — Z20822 Contact with and (suspected) exposure to covid-19: Secondary | ICD-10-CM

## 2019-03-03 LAB — NOVEL CORONAVIRUS, NAA: SARS-CoV-2, NAA: NOT DETECTED

## 2019-03-30 ENCOUNTER — Ambulatory Visit: Payer: 59 | Attending: Internal Medicine

## 2019-03-30 DIAGNOSIS — R238 Other skin changes: Secondary | ICD-10-CM

## 2019-03-30 DIAGNOSIS — U071 COVID-19: Secondary | ICD-10-CM

## 2019-04-01 LAB — NOVEL CORONAVIRUS, NAA: SARS-CoV-2, NAA: NOT DETECTED

## 2019-04-22 LAB — HM COLONOSCOPY

## 2019-06-16 ENCOUNTER — Other Ambulatory Visit: Payer: Self-pay | Admitting: Obstetrics & Gynecology

## 2019-06-16 DIAGNOSIS — Z803 Family history of malignant neoplasm of breast: Secondary | ICD-10-CM

## 2019-07-01 ENCOUNTER — Ambulatory Visit: Payer: 59

## 2019-07-19 ENCOUNTER — Ambulatory Visit
Admission: RE | Admit: 2019-07-19 | Discharge: 2019-07-19 | Disposition: A | Discharge status: Home / Self Care | Payer: 59 | Source: Ambulatory Visit | Attending: Obstetrics & Gynecology | Admitting: Obstetrics & Gynecology

## 2019-07-19 ENCOUNTER — Other Ambulatory Visit: Payer: Self-pay

## 2019-07-19 DIAGNOSIS — Z803 Family history of malignant neoplasm of breast: Secondary | ICD-10-CM

## 2019-07-19 MED ORDER — GADOBUTROL 1 MMOL/ML IV SOLN
10.0000 mL | Freq: Once | INTRAVENOUS | Status: AC | PRN
  Administered 2019-07-19: 10 mL via INTRAVENOUS

## 2019-07-21 ENCOUNTER — Other Ambulatory Visit: Payer: Self-pay

## 2019-07-21 ENCOUNTER — Ambulatory Visit: Payer: 59 | Admitting: Sports Medicine

## 2019-07-21 ENCOUNTER — Encounter: Payer: Self-pay | Admitting: Sports Medicine

## 2019-07-21 VITALS — BP 110/74 | Ht 64.0 in | Wt 138.0 lb

## 2019-07-21 DIAGNOSIS — M542 Cervicalgia: Secondary | ICD-10-CM

## 2019-07-21 MED ORDER — CYCLOBENZAPRINE HCL 5 MG PO TABS: 5.0000 mg | ORAL_TABLET | Freq: Every evening | ORAL | 0 refills | Status: DC | PRN

## 2019-07-21 MED ORDER — MELOXICAM 7.5 MG PO TABS: 7.5000 mg | ORAL_TABLET | Freq: Every day | ORAL | 0 refills | Status: DC

## 2019-07-21 NOTE — Patient Instructions (Signed)
You likely have arthritis in the facet joints in your cervical spine This is different and not related to degenerative disc Please use the meloxicam daily with food You can use the muscle relaxer as needed Use wet heat to your neck, by running a washcloth under room temperature water, rinsing it out, and then placing it in the microwave for 1 minute or until a temperature that is not too hot for you The most important thing you can do are these stretches and strengthening exercises that were provided with you today If you are not feeling better in 2-3 weeks, please make a follow-up appointment Please enjoy your vacation!

## 2019-07-21 NOTE — Progress Notes (Signed)
   [facility] [address] [place], [place] [address] Phone: [phone] Fax: [phone]   Patient Name: Karen Pineda Date of Birth: 1960-07-14 Medical Record Number: [id number] Gender: female Date of Encounter: 07/21/2019  SUBJECTIVE:      Chief Complaint:  Neck pain   HPI:  Karen Pineda is a 59 year old RHD F presenting with 2 weeks of acute left neck pain.  She describes a lot of heavy lifting over the last couple weeks, and thinks she aggravated it during this.  She denies any numbness or tingling into her fingertips.  There is no radiating pain.  She has never injured her neck before, but given her history of lumbar DJD, she was worried about a disc issue.  She is also scheduled to get her first Covid vaccine tomorrow, so would like to avoid any prednisone.  She did use her husband's Flexeril 1-2 times.  Overall, she does feel symptoms have improved to when he first started.     ROS:     See HPI.   PERTINENT  PMH / PSH / FH / SH:  Past Medical, Surgical, Social, and Family History Reviewed & Updated in the EMR. Pertinent findings include:  Lumbar degenerative disc disease, anxiety, receiving first Covid vaccine tomorrow   OBJECTIVE:  BP 110/74   Ht 5\' 4"  (1.626 m)   Wt 138 lb (62.6 kg)   BMI 23.69 kg/m  Physical Exam:  Vital signs are reviewed.   GEN: Alert and oriented, NAD Pulm: Breathing unlabored PSY: normal mood, congruent affect  MSK: Neck No gross deformity, swelling, bruising. No midline/bony TTP. Decreased ROM in lateral flexion and rotation BUE strength 5/5.   Sensation intact to light touch.   2+ equal reflexes in triceps, biceps, brachioradialis tendons. Negative spurlings. NV intact distal BUEs.  I performed some indirect muscle energy techniques to the cervical spine with associated cervical neck traction.  Patient tolerated this well and reported symptom improvement afterward.   ASSESSMENT & PLAN:   1. Cervical  neck pain  I do believe patient likely has underlying facet arthropathy in the cervical spine was exacerbated with her recent activity.  I provided her with a home exercise stretching program to begin.  We have also given her prescription for meloxicam and a muscle relaxer to use as needed.  If she is not feeling better over the next 2-3 weeks, she can return for reassessment at which time we can consider plain film x-ray of her cervical spine.   Lanier Clam, DO, ATC Sports Medicine Fellow  I was the preceptor for this visit and available for immediate consultation Shellia Cleverly, DO

## 2019-07-22 ENCOUNTER — Ambulatory Visit: Payer: 59 | Attending: Internal Medicine

## 2019-07-22 DIAGNOSIS — Z23 Encounter for immunization: Secondary | ICD-10-CM

## 2019-07-22 NOTE — Progress Notes (Signed)
   Covid-19 Vaccination Clinic  Name:  ZAVIA ROZARIO    MRN: [id number] DOB: 09-26-1960  07/22/2019  Ms. Elick was observed post Covid-19 immunization for 15 minutes without incident. She was provided with Vaccine Information Sheet and instruction to access the V-Safe system.   Ms. Dunker was instructed to call 911 with any severe reactions post vaccine: Marland Kitchen Difficulty breathing  . Swelling of face and throat  . A fast heartbeat  . A bad rash all over body  . Dizziness and weakness   Immunizations Administered    Name Date Dose VIS Date Route   Pfizer COVID-19 Vaccine 07/22/2019  8:57 AM 0.3 mL 03/19/2019 Intramuscular   Manufacturer: [facility]   Lot: [id number]   [place]: [id number]

## 2019-08-18 ENCOUNTER — Ambulatory Visit: Payer: 59 | Attending: Internal Medicine

## 2019-08-18 DIAGNOSIS — Z23 Encounter for immunization: Secondary | ICD-10-CM

## 2019-08-18 NOTE — Progress Notes (Signed)
   Covid-19 Vaccination Clinic  Name:  Karen Pineda    MRN: [id number] DOB: 03/02/1961  08/18/2019  [name] was observed post Covid-19 immunization for 15 minutes without incident. She was provided with Vaccine Information Sheet and instruction to access the V-Safe system.   [name] was instructed to call 911 with any severe reactions post vaccine: Marland Kitchen Difficulty breathing  . Swelling of face and throat  . A fast heartbeat  . A bad rash all over body  . Dizziness and weakness   Immunizations Administered    Name Date Dose VIS Date Route   Pfizer COVID-19 Vaccine 08/18/2019  9:12 AM 0.3 mL 06/02/2018 Intramuscular   Manufacturer: [employer]   Lot: [id number]   [place]: [id number]

## 2019-11-24 ENCOUNTER — Other Ambulatory Visit: Payer: Self-pay | Admitting: Obstetrics & Gynecology

## 2019-11-24 DIAGNOSIS — Z1231 Encounter for screening mammogram for malignant neoplasm of breast: Secondary | ICD-10-CM

## 2019-12-07 ENCOUNTER — Other Ambulatory Visit: Payer: Self-pay

## 2019-12-07 ENCOUNTER — Ambulatory Visit
Admission: RE | Admit: 2019-12-07 | Discharge: 2019-12-07 | Disposition: A | Discharge status: Home / Self Care | Payer: 59 | Source: Ambulatory Visit | Attending: Obstetrics & Gynecology | Admitting: Obstetrics & Gynecology

## 2019-12-07 DIAGNOSIS — Z1231 Encounter for screening mammogram for malignant neoplasm of breast: Secondary | ICD-10-CM

## 2020-04-21 ENCOUNTER — Ambulatory Visit: Payer: 59

## 2020-04-24 ENCOUNTER — Encounter: Payer: 59 | Admitting: Family Medicine

## 2020-04-27 DIAGNOSIS — L570 Actinic keratosis: Secondary | ICD-10-CM | POA: Diagnosis not present

## 2020-04-27 DIAGNOSIS — L814 Other melanin hyperpigmentation: Secondary | ICD-10-CM | POA: Diagnosis not present

## 2020-04-27 DIAGNOSIS — L918 Other hypertrophic disorders of the skin: Secondary | ICD-10-CM | POA: Diagnosis not present

## 2020-04-27 DIAGNOSIS — D2272 Melanocytic nevi of left lower limb, including hip: Secondary | ICD-10-CM | POA: Diagnosis not present

## 2020-04-27 DIAGNOSIS — L309 Dermatitis, unspecified: Secondary | ICD-10-CM | POA: Diagnosis not present

## 2020-07-10 ENCOUNTER — Ambulatory Visit: Payer: 59

## 2020-07-12 ENCOUNTER — Encounter: Payer: Self-pay | Admitting: Family Medicine

## 2020-08-21 ENCOUNTER — Other Ambulatory Visit (INDEPENDENT_AMBULATORY_CARE_PROVIDER_SITE_OTHER): Payer: BC Managed Care – PPO

## 2020-08-21 ENCOUNTER — Other Ambulatory Visit: Payer: Self-pay

## 2020-08-21 DIAGNOSIS — E785 Hyperlipidemia, unspecified: Secondary | ICD-10-CM | POA: Diagnosis not present

## 2020-08-21 DIAGNOSIS — Z0000 Encounter for general adult medical examination without abnormal findings: Secondary | ICD-10-CM

## 2020-08-21 DIAGNOSIS — Z130 Encounter for screening for diseases of the blood and blood-forming organs and certain disorders involving the immune mechanism: Secondary | ICD-10-CM

## 2020-08-21 LAB — COMPREHENSIVE METABOLIC PANEL
ALT: 14 U/L (ref 0–35)
AST: 22 U/L (ref 0–37)
Albumin: 4.2 g/dL (ref 3.5–5.2)
Alkaline Phosphatase: 51 U/L (ref 39–117)
BUN: 10 mg/dL (ref 6–23)
CO2: 27 mEq/L (ref 19–32)
Calcium: 9 mg/dL (ref 8.4–10.5)
Chloride: 101 mEq/L (ref 96–112)
Creatinine, Ser: 0.76 mg/dL (ref 0.40–1.20)
GFR: 84.21 mL/min (ref >60.00)
Glucose, Bld: 89 mg/dL (ref 70–99)
Potassium: 3.8 mEq/L (ref 3.5–5.1)
Sodium: 135 mEq/L (ref 135–145)
Total Bilirubin: 0.7 mg/dL (ref 0.2–1.2)
Total Protein: 6.5 g/dL (ref 6.0–8.3)

## 2020-08-21 LAB — CBC WITH DIFFERENTIAL/PLATELET
Basophils Absolute: 0 10*3/uL (ref 0.0–0.1)
Basophils Relative: 0.8 % (ref 0.0–3.0)
Eosinophils Absolute: 0.1 10*3/uL (ref 0.0–0.7)
Eosinophils Relative: 2.3 % (ref 0.0–5.0)
HCT: 36.6 % (ref 36.0–46.0)
Hemoglobin: 12.7 g/dL (ref 12.0–15.0)
Lymphocytes Relative: 25.6 % (ref 12.0–46.0)
Lymphs Abs: 1.6 10*3/uL (ref 0.7–4.0)
MCHC: 34.8 g/dL (ref 30.0–36.0)
MCV: 91.5 fl (ref 78.0–100.0)
Monocytes Absolute: 0.5 10*3/uL (ref 0.1–1.0)
Monocytes Relative: 8.9 % (ref 3.0–12.0)
Neutro Abs: 3.8 10*3/uL (ref 1.4–7.7)
Neutrophils Relative %: 62.4 % (ref 43.0–77.0)
Platelets: 251 10*3/uL (ref 150.0–400.0)
RBC: 4 Mil/uL (ref 3.87–5.11)
RDW: 12.8 % (ref 11.5–15.5)
WBC: 6.1 10*3/uL (ref 4.0–10.5)

## 2020-08-21 LAB — LIPID PANEL
Cholesterol: 184 mg/dL (ref 0–200)
HDL: 83.2 mg/dL (ref >39.00)
LDL Cholesterol: 91 mg/dL (ref 0–99)
NonHDL: 100.48
Total CHOL/HDL Ratio: 2
Triglycerides: 49 mg/dL (ref 0.0–149.0)
VLDL: 9.8 mg/dL (ref 0.0–40.0)

## 2020-08-21 LAB — HEMOGLOBIN A1C: Hgb A1c MFr Bld: 5.7 % (ref 4.6–6.5)

## 2020-08-23 ENCOUNTER — Ambulatory Visit (INDEPENDENT_AMBULATORY_CARE_PROVIDER_SITE_OTHER): Payer: BC Managed Care – PPO | Admitting: Family Medicine

## 2020-08-23 ENCOUNTER — Encounter: Payer: Self-pay | Admitting: Family Medicine

## 2020-08-23 ENCOUNTER — Other Ambulatory Visit: Payer: Self-pay

## 2020-08-23 VITALS — BP 114/72 | HR 59 | Temp 98.3°F | Resp 16 | Ht 64.0 in | Wt 140.8 lb

## 2020-08-23 DIAGNOSIS — R7989 Other specified abnormal findings of blood chemistry: Secondary | ICD-10-CM | POA: Diagnosis not present

## 2020-08-23 DIAGNOSIS — Z1329 Encounter for screening for other suspected endocrine disorder: Secondary | ICD-10-CM

## 2020-08-23 DIAGNOSIS — Z0000 Encounter for general adult medical examination without abnormal findings: Secondary | ICD-10-CM | POA: Diagnosis not present

## 2020-08-23 DIAGNOSIS — Z8349 Family history of other endocrine, nutritional and metabolic diseases: Secondary | ICD-10-CM

## 2020-08-23 DIAGNOSIS — I4510 Unspecified right bundle-branch block: Secondary | ICD-10-CM

## 2020-08-23 DIAGNOSIS — R002 Palpitations: Secondary | ICD-10-CM

## 2020-08-23 LAB — VITAMIN D 25 HYDROXY (VIT D DEFICIENCY, FRACTURES): VITD: 39.41 ng/mL (ref 30.00–100.00)

## 2020-08-23 LAB — TSH: TSH: 1.15 u[IU]/mL (ref 0.35–4.50)

## 2020-08-23 NOTE — Progress Notes (Signed)
Subjective:  Patient ID: Karen Pineda, female    DOB: April 24, 1960  Age: 60 y.o. MRN: [id number]  CC:  Chief Complaint  Patient presents with  . Annual Exam    Pt doing well no concerns at this time     HPI Karen Pineda presents for   Annual physical exam.  History of palpitations discussed in 2020, possible PVCs at that time.  Improved at follow-up. Occasional palpitation - resolves with deep breath.1-2 times per month  Mom with hx of idiopathic pumlonary fibrosis, sjogrens. No personal hx of cough/dyspnea.   Situational anxiety Rare use of alprazolam for sleep or anxiety.Controlled substance database (PDMP) reviewed. No concerns appreciated.  Last prescription for #90 on 12/28/2019 - gets rx from Dr. Nori Pineda. Not daily use and uses 1/2 pill.   Fam hx of hypothyroid: Lab Results  Component Value Date   TSH 2.140 09/30/2018  cold natured - chronic.   Hx of low Vit D 5,000 units per day.  Low of 29 at GYN about 8 years ago.   Hyperlipidemia: Mild hyperlipidemia/borderline elevations previously with low ASCVD risk score, no statin, meds.  Lab Results  Component Value Date   CHOL 184 08/21/2020   HDL 83.20 08/21/2020   LDLCALC 91 08/21/2020   TRIG 49.0 08/21/2020   CHOLHDL 2 08/21/2020   Lab Results  Component Value Date   ALT 14 08/21/2020   AST 22 08/21/2020   ALKPHOS 51 08/21/2020   BILITOT 0.7 08/21/2020   Labs reviewed. Borderline A1c at 5.7 - plans on diet/exercise changes.   Cancer screening Gastroenterologist Dr. Earlean Pineda.  Colonoscopy 04/22/2019, benign polyp, repeat 5 years. Mammogram 12/07/2019, MRI for breast CA screening with FH of breast CA.  Pap testing, most recently seen in September 28, 2018.  She is followed by OB/GYN, Dr. Nori Pineda. Hx of hysterectomy.  Dr.Lomax - derm - yearly eval.  Immunization History  Administered Date(s) Administered  . Influenza,inj,Quad PF,6+ Mos 02/13/2017, 02/16/2018, 01/05/2019  . PFIZER(Purple Top)SARS-COV-2  Vaccination 07/22/2019, 08/18/2019  . PPD Test 02/16/2018  . Tdap 08/18/2017  . Zoster Recombinat (Shingrix) 02/09/2019  covid booster: not planning on booster. No known Covid infection. Thinking about booster.  2nd shingrix -at [facility].    Hearing Screening   125Hz  250Hz  500Hz  1000Hz  2000Hz  3000Hz  4000Hz  6000Hz  8000Hz   Right ear:           Left ear:             Visual Acuity Screening   Right eye Left eye Both eyes  Without correction:     With correction: 20/15 20/15 20/15-1  optho eval yearly. Min cataract. No other eye d/o.    Dental: every 6 months.   Exercise: walking, most days per week. Counting steps. Some swimming.    History Patient Active Problem List   Diagnosis Date Noted  . DDD (degenerative disc disease), lumbar 08/18/2017  . Situational anxiety 08/18/2017  . Ankle pain 12/11/2011   Past Medical History:  Diagnosis Date  . Allergy    seasonal  . Anxiety    occasional  . GERD (gastroesophageal reflux disease)    occasional  . Thyroid disease    Past Surgical History:  Procedure Laterality Date  . ABDOMINAL HYSTERECTOMY     No Known Allergies Prior to Admission medications   Medication Sig Start Date End Date Taking? Authorizing Provider  ALPRAZolam (XANAX) 0.25 MG tablet Take 0.125 mg by mouth daily as needed for anxiety.  Yes [provider]  cyclobenzaprine (FLEXERIL) 5 MG tablet Take 1 tablet (5 mg total) by mouth at bedtime as needed for muscle spasms. 07/21/19  Yes Maneen, Dominic, DO  estradiol (VIVELLE-DOT) 0.1 MG/24HR patch Vivelle-Dot 0.1 mg/24 hr transdermal patch   Yes [provider]  famotidine (PEPCID) 10 MG tablet Take 10 mg by mouth 2 (two) times daily.   Yes [provider]  ibuprofen (ADVIL,MOTRIN) 200 MG tablet Take 200 mg by mouth every 6 (six) hours as needed.    Yes [provider]  Multiple Vitamins-Minerals (CENTRUM SILVER PO) Take 1 tablet by mouth daily.   Yes [provider]   Social History   Socioeconomic History  . Marital status: Married    Spouse name: Not on file  . Number of children: Not on file  . Years of education: Not on file  . Highest education level: Not on file  Occupational History  . Not on file  Tobacco Use  . Smoking status: Never Smoker  . Smokeless tobacco: Never Used  Substance and Sexual Activity  . Alcohol use: Never  . Drug use: Never  . Sexual activity: Not Currently  Other Topics Concern  . Not on file  Social History Narrative  . Not on file   Social Determinants of Health   Financial Resource Strain: Not on file  Food Insecurity: Not on file  Transportation Needs: Not on file  Physical Activity: Not on file  Stress: Not on file  Social Connections: Not on file  Intimate Partner Violence: Not on file    Review of Systems  13 point review of systems per patient health survey noted.  Negative other than as indicated above or in HPI.   Objective:   Vitals:   08/23/20 1434  BP: 114/72  Pulse: (!) 59  Resp: 16  Temp: 98.3 F (36.8 C)  TempSrc: Temporal  SpO2: 98%  Weight: 140 lb 12.8 oz (63.9 kg)  Height: 5\' 4"  (1.626 m)     Physical Exam Vitals reviewed.  Constitutional:      Appearance: She is well-developed.  HENT:     Head: Normocephalic and atraumatic.     Right Ear: External ear normal.     Left Ear: External ear normal.  Eyes:     Conjunctiva/sclera: Conjunctivae normal.     Pupils: Pupils are equal, round, and reactive to light.  Neck:     Thyroid: No thyromegaly.  Cardiovascular:     Rate and Rhythm: Normal rate and regular rhythm.     Heart sounds: Normal heart sounds. No murmur heard.   Pulmonary:     Effort: Pulmonary effort is normal. No respiratory distress.     Breath sounds: Normal breath sounds. No wheezing.  Abdominal:     General: Bowel sounds are normal.     Palpations: Abdomen is soft.     Tenderness: There is no abdominal tenderness.   Musculoskeletal:        General: No tenderness. Normal range of motion.     Cervical back: Normal range of motion and neck supple.  Lymphadenopathy:     Cervical: No cervical adenopathy.  Skin:    General: Skin is warm and dry.     Findings: No rash.  Neurological:     Mental Status: She is alert and oriented to person, place, and time.  Psychiatric:        Behavior: Behavior normal.        Thought Content: Thought  content normal.    EKG: Sinus bradycardia, rate 55.  Right bundle branch block, QRS 134.  Appears new since prior EKG in June 2020.  No other acute findings noted.   Assessment & Plan:  Kimala Mccleese is a 60 y.o. female . Annual physical exam  - -anticipatory guidance as below in AVS, screening labs above. Health maintenance items as above in HPI discussed/recommended as applicable.   Low serum vitamin D - Plan: Vitamin D (25 hydroxy)  - prior low, on supplement above. Check levels (add to prior labs).  Screening for thyroid disorder - Plan: TSH Family history of thyroid disease - Plan: TSH  Palpitations - Plan: EKG 12-Lead, Ambulatory referral to Cardiology New onset right bundle branch block (RBBB) - Plan: Ambulatory referral to Cardiology  - intermittent palpitations. Suspect PVC. However with new RBBB, will refer to cardiology to discuss further workup if needed. Reviewed plan with pt on phone after visit - understanding expressed.   No orders of the defined types were placed in this encounter.  Patient Instructions   If you notice more frequent palpitations, I recommend cardiology appointment to consider monitor.  Return to the clinic or go to the nearest emergency room if any of your symptoms worsen or new symptoms occur. Keeping You Healthy  Get These Tests  Blood Pressure- Have your blood pressure checked by your healthcare provider at least once a year.  Normal blood pressure is 120/80.  Weight- Have your body mass index (BMI) calculated to  screen for obesity.  BMI is a measure of body fat based on height and weight.  You can calculate your own BMI at [employer].it  Cholesterol- Have your cholesterol checked every year.  Diabetes- Have your blood sugar checked every year if you have high blood pressure, high cholesterol, a family history of diabetes or if you are overweight.  Pap Test - Have a pap test every 1 to 5 years if you have been sexually active.  If you are older than 65 and recent pap tests have been normal you may not need additional pap tests.  In addition, if you have had a hysterectomy  for benign disease additional pap tests are not necessary.  Mammogram-Yearly mammograms are essential for early detection of breast cancer  Screening for Colon Cancer- Colonoscopy starting at age 23. Screening may begin sooner depending on your family history and other health conditions.  Follow up colonoscopy as directed by your Gastroenterologist.  Screening for Osteoporosis- Screening begins at age 42 with bone density scanning, sooner if you are at higher risk for developing Osteoporosis.  Get these medicines  Calcium with Vitamin D- Your body requires 1200-1500 mg of Calcium a day and [phone] IU of Vitamin D a day.  You can only absorb 500 mg of Calcium at a time therefore Calcium must be taken in 2 or 3 separate doses throughout the day.  Hormones- Hormone therapy has been associated with increased risk for certain cancers and heart disease.  Talk to your healthcare provider about if you need relief from menopausal symptoms.  Aspirin- Ask your healthcare provider about taking Aspirin to prevent Heart Disease and Stroke.  Get these Immuniztions  Flu shot- Every fall  Pneumonia shot- Once after the age of 62; if you are younger ask your healthcare provider if you need a pneumonia shot. Tetanus- Every ten years. I do recommend covid vaccine booster.   Take these steps  Don't smoke- Your healthcare provider  can help you quit.  For tips on how to quit, ask your healthcare provider or go to www.smokefree.gov or call 1-800 QUIT-NOW.  Be physically active- Exercise 5 days a week for a minimum of 30 minutes.  If you are not already physically active, start slow and gradually work up to 30 minutes of moderate physical activity.  Try walking, dancing, bike riding, swimming, etc.  Eat a healthy diet- Eat a variety of healthy foods such as fruits, vegetables, whole grains, low fat milk, low fat cheeses, yogurt, lean meats, chicken, fish, eggs, dried beans, tofu, etc.  For more information go to www.thenutritionsource.org  Dental visit- Brush and floss teeth twice daily; visit your dentist twice a year.  Eye exam- Visit your Optometrist or Ophthalmologist yearly.  Drink alcohol in moderation- Limit alcohol intake to one drink or less a day.  Never drink and drive.  Depression- Your emotional health is as important as your physical health.  If you're feeling down or losing interest in things you normally enjoy, please talk to your healthcare provider.  Seat Belts- can save your life; always wear one  Smoke/Carbon Monoxide detectors- These detectors need to be installed on the appropriate level of your home.  Replace batteries at least once a year.  Violence- If anyone is threatening or hurting you, please tell your healthcare provider.  Living Will/ Health care power of attorney- Discuss with your healthcare provider and family.  Palpitations Palpitations are feelings that your heartbeat is irregular or is faster than normal. It may feel like your heart is fluttering or skipping a beat. Palpitations are usually not a serious problem. They may be caused by many things, including smoking, caffeine, alcohol, stress, and certain medicines or drugs. Most causes of palpitations are not serious. However, some palpitations can be a sign of a serious problem. You may need further tests to rule out serious medical  problems. Follow these instructions at home: Pay attention to any changes in your condition. Take these actions to help manage your symptoms: Eating and drinking  Avoid foods and drinks that may cause palpitations. These may include: ? Caffeinated coffee, tea, soft drinks, diet pills, and energy drinks. ? Chocolate. ? Alcohol. Lifestyle  Take steps to reduce your stress and anxiety. Things that can help you relax include: ? Yoga. ? Mind-body activities, such as deep breathing, meditation, or using words and images to create positive thoughts (guided imagery). ? Physical activity, such as swimming, jogging, or walking. Tell your health care provider if your palpitations increase with activity. If you have chest pain or shortness of breath with activity, do not continue the activity until you are seen by your health care provider. ? Biofeedback. This is a method that helps you learn to use your mind to control things in your body, such as your heartbeat.  Do not use drugs, including cocaine or ecstasy. Do not use marijuana.  Get plenty of rest and sleep. Keep a regular bed time. General instructions  Take over-the-counter and prescription medicines only as told by your health care provider.  Do not use any products that contain nicotine or tobacco, such as cigarettes and e-cigarettes. If you need help quitting, ask your health care provider.  Keep all follow-up visits as told by your health care provider. This is important. These may include visits for further testing if palpitations do not go away or get worse.      Contact a health care provider if you:  Continue to have a fast or irregular heartbeat after  24 hours.  Notice that your palpitations occur more often. Get help right away if you:  Have chest pain or shortness of breath.  Have a severe headache.  Feel dizzy or you faint. Summary  Palpitations are feelings that your heartbeat is irregular or is faster than normal.  It may feel like your heart is fluttering or skipping a beat.  Palpitations may be caused by many things, including smoking, caffeine, alcohol, stress, certain medicines, and drugs.  Although most causes of palpitations are not serious, some causes can be a sign of a serious medical problem.  Get help right away if you faint or have chest pain, shortness of breath, a severe headache, or dizziness. This information is not intended to replace advice given to you by your health care provider. Make sure you discuss any questions you have with your health care provider. Document Revised: 05/07/2017 Document Reviewed: 05/07/2017 Elsevier Patient Education  2021 [place].       Signed, Merri Ray, MD [facility]

## 2020-08-23 NOTE — Patient Instructions (Addendum)
If you notice more frequent palpitations, I recommend cardiology appointment to consider monitor.  Return to the clinic or go to the nearest emergency room if any of your symptoms worsen or new symptoms occur. Keeping You Healthy  Get These Tests  Blood Pressure- Have your blood pressure checked by your healthcare provider at least once a year.  Normal blood pressure is 120/80.  Weight- Have your body mass index (BMI) calculated to screen for obesity.  BMI is a measure of body fat based on height and weight.  You can calculate your own BMI at [employer]  Cholesterol- Have your cholesterol checked every year.  Diabetes- Have your blood sugar checked every year if you have high blood pressure, high cholesterol, a family history of diabetes or if you are overweight.  Pap Test - Have a pap test every 1 to 5 years if you have been sexually active.  If you are older than 65 and recent pap tests have been normal you may not need additional pap tests.  In addition, if you have had a hysterectomy  for benign disease additional pap tests are not necessary.  Mammogram-Yearly mammograms are essential for early detection of breast cancer  Screening for Colon Cancer- Colonoscopy starting at age 45. Screening may begin sooner depending on your family history and other health conditions.  Follow up colonoscopy as directed by your Gastroenterologist.  Screening for Osteoporosis- Screening begins at age 76 with bone density scanning, sooner if you are at higher risk for developing Osteoporosis.  Get these medicines  Calcium with Vitamin D- Your body requires 1200-1500 mg of Calcium a day and [phone] IU of Vitamin D a day.  You can only absorb 500 mg of Calcium at a time therefore Calcium must be taken in 2 or 3 separate doses throughout the day.  Hormones- Hormone therapy has been associated with increased risk for certain cancers and heart disease.  Talk to your healthcare provider about if  you need relief from menopausal symptoms.  Aspirin- Ask your healthcare provider about taking Aspirin to prevent Heart Disease and Stroke.  Get these Immuniztions  Flu shot- Every fall  Pneumonia shot- Once after the age of 27; if you are younger ask your healthcare provider if you need a pneumonia shot. Tetanus- Every ten years. I do recommend covid vaccine booster.   Take these steps  Don't smoke- Your healthcare provider can help you quit. For tips on how to quit, ask your healthcare provider or go to www.smokefree.gov or call 1-800 QUIT-NOW.  Be physically active- Exercise 5 days a week for a minimum of 30 minutes.  If you are not already physically active, start slow and gradually work up to 30 minutes of moderate physical activity.  Try walking, dancing, bike riding, swimming, etc.  Eat a healthy diet- Eat a variety of healthy foods such as fruits, vegetables, whole grains, low fat milk, low fat cheeses, yogurt, lean meats, chicken, fish, eggs, dried beans, tofu, etc.  For more information go to www.thenutritionsource.org  Dental visit- Brush and floss teeth twice daily; visit your dentist twice a year.  Eye exam- Visit your Optometrist or Ophthalmologist yearly.  Drink alcohol in moderation- Limit alcohol intake to one drink or less a day.  Never drink and drive.  Depression- Your emotional health is as important as your physical health.  If you're feeling down or losing interest in things you normally enjoy, please talk to your healthcare provider.  Seat Belts- can save your life; always  wear one  Smoke/Carbon Monoxide detectors- These detectors need to be installed on the appropriate level of your home.  Replace batteries at least once a year.  Violence- If anyone is threatening or hurting you, please tell your healthcare provider.  Living Will/ Health care power of attorney- Discuss with your healthcare provider and family.  Palpitations Palpitations are feelings that  your heartbeat is irregular or is faster than normal. It may feel like your heart is fluttering or skipping a beat. Palpitations are usually not a serious problem. They may be caused by many things, including smoking, caffeine, alcohol, stress, and certain medicines or drugs. Most causes of palpitations are not serious. However, some palpitations can be a sign of a serious problem. You may need further tests to rule out serious medical problems. Follow these instructions at home: Pay attention to any changes in your condition. Take these actions to help manage your symptoms: Eating and drinking  Avoid foods and drinks that may cause palpitations. These may include: ? Caffeinated coffee, tea, soft drinks, diet pills, and energy drinks. ? Chocolate. ? Alcohol. Lifestyle  Take steps to reduce your stress and anxiety. Things that can help you relax include: ? Yoga. ? Mind-body activities, such as deep breathing, meditation, or using words and images to create positive thoughts (guided imagery). ? Physical activity, such as swimming, jogging, or walking. Tell your health care provider if your palpitations increase with activity. If you have chest pain or shortness of breath with activity, do not continue the activity until you are seen by your health care provider. ? Biofeedback. This is a method that helps you learn to use your mind to control things in your body, such as your heartbeat.  Do not use drugs, including cocaine or ecstasy. Do not use marijuana.  Get plenty of rest and sleep. Keep a regular bed time. General instructions  Take over-the-counter and prescription medicines only as told by your health care provider.  Do not use any products that contain nicotine or tobacco, such as cigarettes and e-cigarettes. If you need help quitting, ask your health care provider.  Keep all follow-up visits as told by your health care provider. This is important. These may include visits for further  testing if palpitations do not go away or get worse.      Contact a health care provider if you:  Continue to have a fast or irregular heartbeat after 24 hours.  Notice that your palpitations occur more often. Get help right away if you:  Have chest pain or shortness of breath.  Have a severe headache.  Feel dizzy or you faint. Summary  Palpitations are feelings that your heartbeat is irregular or is faster than normal. It may feel like your heart is fluttering or skipping a beat.  Palpitations may be caused by many things, including smoking, caffeine, alcohol, stress, certain medicines, and drugs.  Although most causes of palpitations are not serious, some causes can be a sign of a serious medical problem.  Get help right away if you faint or have chest pain, shortness of breath, a severe headache, or dizziness. This information is not intended to replace advice given to you by your health care provider. Make sure you discuss any questions you have with your health care provider. Document Revised: 05/07/2017 Document Reviewed: 05/07/2017 Elsevier Patient Education  2021 [employer].

## 2020-09-06 ENCOUNTER — Emergency Department
Admission: RE | Admit: 2020-09-06 | Discharge: 2020-09-06 | Disposition: A | Discharge status: Home / Self Care | Payer: BC Managed Care – PPO | Source: Ambulatory Visit

## 2020-09-06 ENCOUNTER — Other Ambulatory Visit: Payer: Self-pay

## 2020-09-06 VITALS — BP 128/76 | HR 76 | Temp 100.1°F | Resp 20 | Ht 64.0 in | Wt 137.0 lb

## 2020-09-06 DIAGNOSIS — R059 Cough, unspecified: Secondary | ICD-10-CM | POA: Diagnosis not present

## 2020-09-06 DIAGNOSIS — J0100 Acute maxillary sinusitis, unspecified: Secondary | ICD-10-CM

## 2020-09-06 DIAGNOSIS — J309 Allergic rhinitis, unspecified: Secondary | ICD-10-CM | POA: Diagnosis not present

## 2020-09-06 MED ORDER — CEFDINIR 300 MG PO CAPS: 300.0000 mg | ORAL_CAPSULE | Freq: Two times a day (BID) | ORAL | 0 refills | Status: AC

## 2020-09-06 MED ORDER — BENZONATATE 200 MG PO CAPS: 200.0000 mg | ORAL_CAPSULE | Freq: Three times a day (TID) | ORAL | 0 refills | Status: AC | PRN

## 2020-09-06 MED ORDER — FEXOFENADINE HCL 180 MG PO TABS: 180.0000 mg | ORAL_TABLET | Freq: Every day | ORAL | 0 refills | Status: DC

## 2020-09-06 NOTE — ED Provider Notes (Signed)
[facility]    CSN: [id number] Arrival date & time: 09/06/20  1438      History   Chief Complaint Chief Complaint  Patient presents with  . Cough  . Fever    HPI Karen Pineda is a 60 y.o. female.   HPI 60 year old female presents with dry cough, intermittent fever, and nasal congestion for 1 week.  Patient reports recent negative home COVID-19 test.  Reports she has been vaccinated for COVID-19 and influenza.  Past Medical History:  Diagnosis Date  . Allergy    seasonal  . Anxiety    occasional  . GERD (gastroesophageal reflux disease)    occasional    Patient Active Problem List   Diagnosis Date Noted  . DDD (degenerative disc disease), lumbar 08/18/2017  . Situational anxiety 08/18/2017  . Ankle pain 12/11/2011    Past Surgical History:  Procedure Laterality Date  . ABDOMINAL HYSTERECTOMY      OB History   No obstetric history on file.      Home Medications    Prior to Admission medications   Medication Sig Start Date End Date Taking? Authorizing Provider  benzonatate (TESSALON) 200 MG capsule Take 1 capsule (200 mg total) by mouth 3 (three) times daily as needed for up to 7 days for cough. 09/06/20 09/13/20 Yes Eliezer Lofts, FNP  cefdinir (OMNICEF) 300 MG capsule Take 1 capsule (300 mg total) by mouth 2 (two) times daily for 7 days. 09/06/20 09/13/20 Yes Eliezer Lofts, FNP  fexofenadine [facility] ALLERGY) 180 MG tablet Take 1 tablet (180 mg total) by mouth daily for 15 days. 09/06/20 09/21/20 Yes Eliezer Lofts, FNP  ALPRAZolam Duanne Moron) 0.25 MG tablet Take 0.125 mg by mouth daily as needed for anxiety.     [provider]  cyclobenzaprine (FLEXERIL) 5 MG tablet Take 1 tablet (5 mg total) by mouth at bedtime as needed for muscle spasms. 07/21/19   Maneen, Dominic, DO  estradiol (VIVELLE-DOT) 0.1 MG/24HR patch Vivelle-Dot 0.1 mg/24 hr transdermal patch    [provider]  famotidine (PEPCID) 10 MG tablet Take 10 mg by mouth 2 (two)  times daily.    [provider]  ibuprofen (ADVIL,MOTRIN) 200 MG tablet Take 200 mg by mouth every 6 (six) hours as needed.     [provider]  Multiple Vitamins-Minerals (CENTRUM SILVER PO) Take 1 tablet by mouth daily.    [provider]    Family History Family History  Problem Relation Age of Onset  . Cancer Mother        breast  . Depression Mother   . Breast cancer Mother   . Diabetes Father   . Hyperlipidemia Father   . Hypertension Father   . Cerebral palsy Brother   . Asthma Maternal Grandmother   . Asthma Maternal Grandfather   . Cancer Paternal Grandmother        breast  . Heart disease Paternal Grandmother   . Heart disease Paternal Grandfather   . Hypertension Paternal Grandfather   . Breast cancer Maternal Aunt     Social History Social History   Tobacco Use  . Smoking status: Never Smoker  . Smokeless tobacco: Never Used  Substance Use Topics  . Alcohol use: Never  . Drug use: Never     Allergies   Patient has no known allergies.   Review of Systems Review of Systems  Constitutional: Positive for fever.  HENT: Positive for congestion.   Eyes: Negative.   Respiratory: Positive for  cough.   Cardiovascular: Negative.   Gastrointestinal: Negative.   Genitourinary: Negative.   Musculoskeletal: Negative.   Skin: Negative.   Neurological: Negative.      Physical Exam Triage Vital Signs ED Triage Vitals  Enc Vitals Group     BP 09/06/20 1609 128/76     Pulse Rate 09/06/20 1609 76     Resp 09/06/20 1609 20     Temp 09/06/20 1609 100.1 F (37.8 C)     Temp Source 09/06/20 1609 Oral     SpO2 09/06/20 1609 98 %     Weight 09/06/20 1606 137 lb (62.1 kg)     Height 09/06/20 1606 5\' 4"  (1.626 m)     Head Circumference --      Peak Flow --      Pain Score 09/06/20 1605 3     Pain Loc --      Pain Edu? --      Excl. in [place]? --    No data found.  Updated Vital Signs BP 128/76 (BP Location: Right Arm)   Pulse 76    Temp 100.1 F (37.8 C) (Oral)   Resp 20   Ht 5\' 4"  (1.626 m)   Wt 137 lb (62.1 kg)   SpO2 98%   BMI 23.52 kg/m      Physical Exam Vitals and nursing note reviewed.  Constitutional:      General: She is not in acute distress.    Appearance: Normal appearance. She is normal weight. She is not ill-appearing.  HENT:     Head: Normocephalic and atraumatic.     Right Ear: Tympanic membrane is retracted.     Left Ear: Tympanic membrane is retracted.     Ears:     Comments: Eustachian tube dysfunction noted bilaterally    Nose:     Right Turbinates: Enlarged.     Left Turbinates: Enlarged.     Comments: Turbinates are erythematous    Mouth/Throat:     Lips: Pink.     Mouth: Mucous membranes are moist.     Pharynx: Oropharynx is clear. Uvula midline. No pharyngeal swelling or posterior oropharyngeal erythema.     Comments: Moderate amount of clear drainage noted of posterior oropharynx Cardiovascular:     Rate and Rhythm: Normal rate and regular rhythm.     Pulses: Normal pulses.     Heart sounds: Normal heart sounds.  Pulmonary:     Effort: Pulmonary effort is normal.     Breath sounds: Normal breath sounds.     Comments: No adventitious breath sounds, infrequent nonproductive cough noted on exam Musculoskeletal:        General: Normal range of motion.     Cervical back: Normal range of motion. No tenderness.  Lymphadenopathy:     Cervical: No cervical adenopathy.  Skin:    General: Skin is warm and dry.  Neurological:     General: No focal deficit present.     Mental Status: She is alert and oriented to person, place, and time.  Psychiatric:        Mood and Affect: Mood normal.        Behavior: Behavior normal.      UC Treatments / Results  Labs (all labs ordered are listed, but only abnormal results are displayed) Labs Reviewed - No data to display  EKG   Radiology No results found.  Procedures Procedures (including critical care time)  Medications  Ordered in UC Medications - No data to  display  Initial Impression / Assessment and Plan / UC Course  I have reviewed the triage vital signs and the nursing notes.  Pertinent labs & imaging results that were available during my care of the patient were reviewed by me and considered in my medical decision making (see chart for details).    MDM: 1.  Subacute maxillary sinusitis, 2.  Cough, 3.  Allergic rhinitis.  Patient discharged home, hemodynamically stable  Final Clinical Impressions(s) / UC Diagnoses   Final diagnoses:  Subacute maxillary sinusitis  Cough  Allergic rhinitis, unspecified seasonality, unspecified trigger     Discharge Instructions     Advised/instructed patient to take medication as directed to completion.  Advised patient to hold daily Claritin/Zyrtec/Flonase and start Allegra 180 mg daily for 3 to 5 days of antibiotic course then as needed.  Advised/instructed patient may use Tessalon Perles daily, as needed for cough.    ED Prescriptions    Medication Sig Dispense Auth. Provider   cefdinir (OMNICEF) 300 MG capsule Take 1 capsule (300 mg total) by mouth 2 (two) times daily for 7 days. 14 capsule Eliezer Lofts, FNP   fexofenadine [facility] ALLERGY) 180 MG tablet Take 1 tablet (180 mg total) by mouth daily for 15 days. 15 tablet Eliezer Lofts, FNP   benzonatate (TESSALON) 200 MG capsule Take 1 capsule (200 mg total) by mouth 3 (three) times daily as needed for up to 7 days for cough. 30 capsule Eliezer Lofts, FNP     PDMP not reviewed this encounter.   Eliezer Lofts, [place] 09/06/20 [phone]

## 2020-09-06 NOTE — Discharge Instructions (Signed)
Advised/instructed patient to take medication as directed to completion.  Advised patient to hold daily Claritin/Zyrtec/Flonase and start Allegra 180 mg daily for 3 to 5 days of antibiotic course then as needed.  Advised/instructed patient may use Tessalon Perles daily, as needed for cough.

## 2020-09-06 NOTE — ED Triage Notes (Signed)
Pt presents to Urgent Care with c/o dry cough x one week and fever x one day. Has some nasal congestion as well. Pt reports negative home COVID test; has been vaccinated against COVID and flu.

## 2020-09-07 ENCOUNTER — Telehealth: Payer: BC Managed Care – PPO | Admitting: Family Medicine

## 2020-09-09 ENCOUNTER — Telehealth: Payer: BC Managed Care – PPO | Admitting: Nurse Practitioner

## 2020-09-09 ENCOUNTER — Encounter: Payer: Self-pay | Admitting: Nurse Practitioner

## 2020-09-09 DIAGNOSIS — U071 COVID-19: Secondary | ICD-10-CM

## 2020-09-09 MED ORDER — NIRMATRELVIR/RITONAVIR (PAXLOVID)TABLET: 3.0000 | ORAL_TABLET | Freq: Two times a day (BID) | ORAL | 0 refills | Status: AC

## 2020-09-09 NOTE — Progress Notes (Signed)
[name], Karen Pineda are scheduled for a virtual visit with Karen Pineda< FNP today.    Just as we do with appointments in the office, we must obtain your consent to participate.  Your consent will be active for this visit and any virtual visit you may have with one of our providers in the next 365 days.    If you have a MyChart account, I can also send a copy of this consent to you electronically.  All virtual visits are billed to your insurance company just like a traditional visit in the office.  As this is a virtual visit, video technology does not allow for your provider to perform a traditional examination.  This may limit your provider's ability to fully assess your condition.  If your provider identifies any concerns that need to be evaluated in person or the need to arrange testing such as labs, EKG, etc, we will make arrangements to do so.    Although advances in technology are sophisticated, we cannot ensure that it will always work on either your end or our end.  If the connection with a video visit is poor, we may have to switch to a telephone visit.  With either a video or telephone visit, we are not always able to ensure that we have a secure connection.   I need to obtain your verbal consent now.   Are you willing to proceed with your visit today?   Karen Pineda has provided verbal consent on 09/09/2020 for a virtual visit (video or telephone).  Virtual Visit via Video  I connected with  Karen Pineda  on 09/09/20 at 5:25 by video and verified that I am speaking with the correct person using two identifiers. Karen Pineda is currently located at home and her husband is is currently with her during visit. The provider, Karen Hassell Done, FNP is located at home at time of visit.  I discussed the limitations, risks, security and privacy concerns of performing an evaluation and management service by video  and the availability of in person appointments. I also  discussed with the patient that there may be a patient responsible charge related to this service. The patient expressed understanding and agreed to proceed.   Subjective:   HPI:   Patient was seen in Urgent care Wednesday and was dx with sinus infection. She tested negative for covid.Marland Kitchen She was given omnicef. Her husband tested positive today. She retested today and was positive. She feels like she has a fever but she does not have one. She has no other symptoms and has no comorbid conditions. Her husband was given paxlovid prescription and she is requesting one.  Review of Systems  Constitutional: Negative for chills and fever.  HENT: Positive for congestion.   Respiratory: Negative for cough, sputum production and shortness of breath.   Musculoskeletal: Negative for myalgias.  Neurological: Negative for headaches.  All other systems reviewed and are negative.    See pertinent positives and negatives per HPI.  Patient Active Problem List   Diagnosis Date Noted  . DDD (degenerative disc disease), lumbar 08/18/2017  . Situational anxiety 08/18/2017  . Ankle pain 12/11/2011    Social History   Tobacco Use  . Smoking status: Never Smoker  . Smokeless tobacco: Never Used  Substance Use Topics  . Alcohol use: Never    Current Outpatient Medications:  .  ALPRAZolam (XANAX) 0.25 MG tablet, Take 0.125 mg by mouth daily as needed for anxiety. , Disp: , Rfl:  .  benzonatate (TESSALON) 200 MG capsule, Take 1 capsule (200 mg total) by mouth 3 (three) times daily as needed for up to 7 days for cough., Disp: 30 capsule, Rfl: 0 .  cefdinir (OMNICEF) 300 MG capsule, Take 1 capsule (300 mg total) by mouth 2 (two) times daily for 7 days., Disp: 14 capsule, Rfl: 0 .  cyclobenzaprine (FLEXERIL) 5 MG tablet, Take 1 tablet (5 mg total) by mouth at bedtime as needed for muscle spasms., Disp: 30 tablet, Rfl: 0 .  estradiol (VIVELLE-DOT) 0.1 MG/24HR patch, Vivelle-Dot 0.1 mg/24 hr transdermal patch,  Disp: , Rfl:  .  famotidine (PEPCID) 10 MG tablet, Take 10 mg by mouth 2 (two) times daily., Disp: , Rfl:  .  fexofenadine (ALLEGRA ALLERGY) 180 MG tablet, Take 1 tablet (180 mg total) by mouth daily for 15 days., Disp: 15 tablet, Rfl: 0 .  ibuprofen (ADVIL,MOTRIN) 200 MG tablet, Take 200 mg by mouth every 6 (six) hours as needed. , Disp: , Rfl:  .  Multiple Vitamins-Minerals (CENTRUM SILVER PO), Take 1 tablet by mouth daily., Disp: , Rfl:   No Known Allergies  Objective:   There were no vitals taken for this visit.  Patient is well-developed, well-nourished in no acute distress.  Resting comfortably at home.  Head is normocephalic, atraumatic.  No labored breathing.  Speech is clear and coherent with logical content.  Patient is alert and oriented at baseline.   Assessment and Plan:        Karen Pineda in today with chief complaint of No chief complaint on file.   1. Lab test positive for detection of COVID-19 virus Force fluids Rest OTC treatmnets if any symptoms develop: Tylenol or motrin for fever or body aches mucinex for cough. 'if SOB develops gp to hospital paxlovid was rx and patient will see haow she feels later this evening and will pick up if she needs it. Meds ordered this encounter  Medications  . nirmatrelvir/ritonavir EUA (PAXLOVID) TABS    Sig: Take 3 tablets by mouth 2 (two) times daily for 5 days. (Take nirmatrelvir 150 mg two tablets twice daily for 5 days and ritonavir 100 mg one tablet twice daily for 5 days) Patient GFR is 86    Dispense:  30 tablet    Refill:  0    Order Specific Question:   Supervising Provider    Answer:   Noemi Chapel [[id number]]       The above assessment and management plan was discussed with the patient. The patient verbalized understanding of and has agreed to the management plan. Patient is aware to call the clinic if symptoms persist or worsen. Patient is aware when to return to the clinic for a follow-up visit. Patient  educated on when it is appropriate to go to the emergency department.   [place], FNP  .   [place], [place] 09/09/2020  Time spent with the patient: 12 minutes, of which >50% was spent in obtaining information about symptoms, reviewing previous labs, evaluations, and treatments, counseling about condition (please see the discussed topics above), and developing a plan to further investigate it; had a number of questions which I addressed.

## 2020-09-09 NOTE — Patient Instructions (Signed)
You are being prescribed PAXLOVID for COVID-19 infection.     Please pick up your [facility] at: [facility] Address:  [address]      Hours:  Monday - Friday 7:30 am - 6PM                         Saturday 8:00 am - 4:30 pm      Sunday Closed  Phone: [phone]  Please pick up your prescription at: [facility]  Address:  [facility], [address] [place], [place] [address]      Hours:  Monday - Friday 7:30 am - 5:30 pm.  Saturday Closed   Sunday Closed  Phone: [place] ([place], [place], [place] [address][phone]) Monday through Friday 7:30a-6p  [place] [phone], [place], [place] [address] [phone]) Monday through Friday 7:30a-6p  [facility] [place] ([place], [place] [address][phone])  Monday through Friday 8a-5:30p  [facility] [address] [place], [place] [address] [phone]) Hours Monday through Friday 8a-7p, Saturday 8a-5p, Sunday 1p-5p    Please [facility] into   Please call the pharmacy or go through the drive through vs going inside if you are picking up the mediation yourself to prevent further spread. If prescribed to a [facility] affiliated pharmacy, a pharmacist will bring the medication out to your car.   Medications to hold while taking this treatment: none *If asked to hold, you can resume them 24 hours after your last dose   ADMINISTRATION INSTRUCTIONS: 1. Take with or without food. Swallow the tablets whole. Don't chew, crush, or break the medications because it might not work as well  2. For each dose of the medication, you should be taking 3 tablets together (2 pink oval and 1 white oval) TWICE a day for FIVE days   3. Finish your full five-day course of Paxlovid even if you feel better before you're done. Stopping this medication too early can make it  less effective to prevent severe illness related to [place].    4. Paxlovid is prescribed for YOU ONLY. Don't share it with others, even if they have similar symptoms as you. This medication might not be right for everyone.  5. Make sure to take steps to protect yourself and others while you're taking this medication in order to get well soon and to prevent others from getting sick with COVID-19.  6. Paxlovid (nirmatrelvir / ritonavir) can cause hormonal birth control medications to not work well. If you or your partner is currently taking hormonal birth control, use condoms or other birth control methods to prevent unintended pregnancies.    COMMON SIDE EFFECTS: 1. Altered or bad taste in your mouth  2. Diarrhea  3. High blood pressure (1% of people) 4. Muscle aches (1% of people)     If your COVID-19 symptoms get worse, get medical help right away. Call 911 if you experience symptoms such as worsening cough, trouble breathing, chest pain that doesn't go away, confusion, a hard time staying awake, and pale or blue-colored skin. This medication won't prevent all COVID-19 cases from getting worse.

## 2020-09-30 ENCOUNTER — Encounter: Payer: Self-pay | Admitting: Emergency Medicine

## 2020-09-30 ENCOUNTER — Telehealth: Payer: BC Managed Care – PPO | Admitting: Emergency Medicine

## 2020-09-30 DIAGNOSIS — S90561A Insect bite (nonvenomous), right ankle, initial encounter: Secondary | ICD-10-CM

## 2020-09-30 DIAGNOSIS — L089 Local infection of the skin and subcutaneous tissue, unspecified: Secondary | ICD-10-CM

## 2020-09-30 DIAGNOSIS — W57XXXA Bitten or stung by nonvenomous insect and other nonvenomous arthropods, initial encounter: Secondary | ICD-10-CM

## 2020-09-30 MED ORDER — CEPHALEXIN 500 MG PO CAPS: 500.0000 mg | ORAL_CAPSULE | Freq: Two times a day (BID) | ORAL | 0 refills | Status: AC

## 2020-09-30 NOTE — Progress Notes (Signed)
[name], Karen Pineda are scheduled for a virtual visit with your provider today.    Just as we do with appointments in the office, we must obtain your consent to participate.  Your consent will be active for this visit and any virtual visit you may have with one of our providers in the next 365 days.    If you have a MyChart account, I can also send a copy of this consent to you electronically.  All virtual visits are billed to your insurance company just like a traditional visit in the office.  As this is a virtual visit, video technology does not allow for your provider to perform a traditional examination.  This may limit your provider's ability to fully assess your condition.  If your provider identifies any concerns that need to be evaluated in person or the need to arrange testing such as labs, EKG, etc, we will make arrangements to do so.    Although advances in technology are sophisticated, we cannot ensure that it will always work on either your end or our end.  If the connection with a video visit is poor, we may have to switch to a telephone visit.  With either a video or telephone visit, we are not always able to ensure that we have a secure connection.   I need to obtain your verbal consent now.   Are you willing to proceed with your visit today?   Karen Pineda has provided verbal consent on 09/30/2020 for a virtual visit (video or telephone).   Karen Gens, PA-C 09/30/2020  4:45 PM   Date:  09/30/2020   ID:  Karen Pineda, DOB Aug 24, 1960, MRN [id number]  Patient Location: Home Provider Location: Home Office   Participants: Patient and Provider for Visit and Wrap up  Method of visit: Video  Location of Patient: Home Location of Provider: Home Office Consent was obtain for visit over the video. Services rendered by provider: Visit was performed via video  A video enabled telemedicine application was used and I verified that I am speaking with the correct person using  two identifiers.  PCP:  Wendie Agreste, MD   Chief Complaint:  redness and swelling of Right ankle  History of Present Illness:    Karen Pineda is a 60 y.o. female with history as stated below. Presents video telehealth for an acute care visit  Onset of symptoms was yesterday after and symptoms have been persistent and include: swelling, redness, itching and burning to Right ankle. Symptoms started after working out in her yard. She believes she was bit by something.  She has tried multiple medications and home treatments including oral benadryl, topical hydrocortisone 2.5%, Allohist, cool pack and elevation with mild relief.   History of similar reaction to an insect bite about a year ago.  She was started on Keflex and did well at that time.  She does note she was dx with COVID about 3 weeks ago but has been doing well with those symptoms.   Denies having fevers, chills, oral swelling, hives, calf or bone pain.    No other aggravating or relieving factors.  No other c/o.  Stinging and burning, swelling   Past Medical, Surgical, Social History, Allergies, and Medications have been Reviewed.  Patient Active Problem List   Diagnosis Date Noted   DDD (degenerative disc disease), lumbar 08/18/2017   Situational anxiety 08/18/2017   Ankle pain 12/11/2011    Social History   Tobacco Use  Smoking status: Never   Smokeless tobacco: Never  Substance Use Topics   Alcohol use: Never     Current Outpatient Medications:    ALPRAZolam (XANAX) 0.25 MG tablet, Take 0.125 mg by mouth daily as needed for anxiety. , Disp: , Rfl:    cyclobenzaprine (FLEXERIL) 5 MG tablet, Take 1 tablet (5 mg total) by mouth at bedtime as needed for muscle spasms., Disp: 30 tablet, Rfl: 0   estradiol (VIVELLE-DOT) 0.1 MG/24HR patch, Vivelle-Dot 0.1 mg/24 hr transdermal patch, Disp: , Rfl:    famotidine (PEPCID) 10 MG tablet, Take 10 mg by mouth 2 (two) times daily., Disp: , Rfl:    fexofenadine  (ALLEGRA ALLERGY) 180 MG tablet, Take 1 tablet (180 mg total) by mouth daily for 15 days., Disp: 15 tablet, Rfl: 0   ibuprofen (ADVIL,MOTRIN) 200 MG tablet, Take 200 mg by mouth every 6 (six) hours as needed. , Disp: , Rfl:    Multiple Vitamins-Minerals (CENTRUM SILVER PO), Take 1 tablet by mouth daily., Disp: , Rfl:    No Known Allergies   ROS See HPI for history of present illness.  Physical Exam Constitutional:      General: She is not in acute distress.    Appearance: Normal appearance. She is not ill-appearing, toxic-appearing or diaphoretic.     Comments: Pt is in her kitchen, appears well. Alert and cooperative during exam.   Eyes:     Extraocular Movements: Extraocular movements intact.  Pulmonary:     Effort: Pulmonary effort is normal. No respiratory distress.  Musculoskeletal:        General: Normal range of motion.     Cervical back: Normal range of motion.     Comments: Right ankle: full ROM. Mild edema. Mild tenderness to touch around ankle per pt.  Calf is soft and non-tender per pt.   Skin:    Findings: Erythema present.     Comments: Right ankle: erythema around ankle, slight warm to touch compared to Left ankle per pt. Skin in tact and dry per pt.   Neurological:     Mental Status: She is alert.  Psychiatric:        Mood and Affect: Mood normal.        Behavior: Behavior normal.              A&P  Infected insect bite of Right ankle, initial encounter.  Continue hydrocortisone cream and benadryl as needed for itching. Continue elevation, may alternate cool and warm compresses.  May start Keflex for suspected secondary bacterial infection of insect bite. Discussed oral prednisone, will hold off for now  Discussed signs/symptoms that warrant in-person evaluation.     Patient voiced understanding and agreement to plan.   Time:   Today, I have spent 15 minutes with the patient with telehealth technology discussing the above problems, reviewing the chart,  previous notes, medications and orders.    Tests Ordered: No orders of the defined types were placed in this encounter.   Medication Changes: No orders of the defined types were placed in this encounter.    Disposition:  Follow up PCP or UC as needed if not improving, or symptoms worsening.    Etta Grandchild, PA-C  09/30/2020 4:45 PM

## 2020-09-30 NOTE — Patient Instructions (Signed)
  Take antibiotic as prescribed.   Follow up with primary care or urgent care if not improving in 3-4 days. Follow up sooner if symptoms worsening- worsening pain, swelling, fever, or other new concerning symptoms develop.   See clinical reference for home care.

## 2020-10-11 DIAGNOSIS — Z01419 Encounter for gynecological examination (general) (routine) without abnormal findings: Secondary | ICD-10-CM | POA: Diagnosis not present

## 2020-10-11 DIAGNOSIS — Z6824 Body mass index (BMI) 24.0-24.9, adult: Secondary | ICD-10-CM | POA: Diagnosis not present

## 2020-11-01 ENCOUNTER — Other Ambulatory Visit: Payer: Self-pay | Admitting: Obstetrics & Gynecology

## 2020-11-01 DIAGNOSIS — Z1231 Encounter for screening mammogram for malignant neoplasm of breast: Secondary | ICD-10-CM

## 2020-11-08 ENCOUNTER — Other Ambulatory Visit: Payer: Self-pay

## 2020-11-08 ENCOUNTER — Ambulatory Visit: Payer: BC Managed Care – PPO | Admitting: Cardiovascular Disease

## 2020-11-08 ENCOUNTER — Encounter: Payer: Self-pay | Admitting: Cardiovascular Disease

## 2020-11-08 VITALS — BP 116/68 | HR 60 | Ht 64.0 in | Wt 141.2 lb

## 2020-11-08 DIAGNOSIS — I4510 Unspecified right bundle-branch block: Secondary | ICD-10-CM

## 2020-11-08 DIAGNOSIS — R002 Palpitations: Secondary | ICD-10-CM

## 2020-11-08 NOTE — Patient Instructions (Signed)
Medication Instructions:  Your physician recommends that you continue on your current medications as directed. Please refer to the Current Medication list given to you today.  *If you need a refill on your cardiac medications before your next appointment, please call your pharmacy*   Lab Work: None ordered.    Testing/Procedures:  Your physician has requested that you have an echocardiogram. Echocardiography is a painless test that uses sound waves to create images of your heart. It provides your doctor with information about the size and shape of your heart and how well your heart's chambers and valves are working. This procedure takes approximately one hour. There are no restrictions for this procedure.    Follow-Up: At [facility], you and your health needs are our priority.  As part of our continuing mission to provide you with exceptional heart care, we have created designated Provider Care Teams.  These Care Teams include your primary Cardiologist (physician) and Advanced Practice Providers (APPs -  Physician Assistants and Nurse Practitioners) who all work together to provide you with the care you need, when you need it.  We recommend signing up for the patient portal called "MyChart".  Sign up information is provided on this After Visit Summary.  MyChart is used to connect with patients for Virtual Visits (Telemedicine).  Patients are able to view lab/test results, encounter notes, upcoming appointments, etc.  Non-urgent messages can be sent to your provider as well.   To learn more about what you can do with MyChart, go to [email or web address].    Your next appointment:   Follow up as directed following your echocardiogram.   The format for your next appointment:   In Person  Provider:   Shelva Majestic, MD

## 2020-11-08 NOTE — Progress Notes (Signed)
Cardiology Office Note    Date:  11/11/2020   ID:  Karen, Pineda 10/26/1960, MRN [id number]  PCP:  Wendie Agreste, MD  Cardiologist:  Shelva Majestic, MD   New cardiology evaluation referred through the courtesy of Dr. Cindee Lame with intermittent palpitations and right bundle branch block.  History of Present Illness:  Karen Pineda is a 60 y.o. 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  She is followed by Dr. Cindee Lame for primary care.  There is a reported history of palpitations which were initially discussed in 2020 and the possibility of PVCs were considered.  Subsequently these improved at a follow-up with Dr. Nyoka Cowden.  An ECG in June 2020 showed mild RV conduction delay without definitive right bundle branch block with sinus bradycardia at 56 bpm.  She had recently been evaluated by Dr. Nyoka Cowden in May 2022.  At that time, her ECG showed right bundle branch block.  She denied any chest pain or shortness of breath.  She admitted to just with rare episodes of palpitations and denied any exertional precipitation.  She was evaluated at [facility] urgent care on September 06, 2020 with dry cough, intermittent fever and nasal congestion.  The COVID test at home was negative.  It was felt she may have had subacute maxillary sinusitis and allergic rhinitis and she was advised to start Allegra and Tessalon Pearls.  Presently, she feels well.  She remains active.  She denies chest pain or shortness of breath or recent palpitations.  She presents to the office for cardiology evaluation.  Past Medical History:  Diagnosis Date   Allergy    seasonal   Anxiety    occasional   GERD (gastroesophageal reflux disease)    occasional    Past Surgical History:  Procedure Laterality Date   ABDOMINAL HYSTERECTOMY      Current Medications: Outpatient Medications Prior to Visit  Medication Sig Dispense Refill   ALPRAZolam (XANAX) 0.25 MG tablet Take 0.125 mg by mouth daily as  needed for anxiety.      estradiol (VIVELLE-DOT) 0.1 MG/24HR patch Vivelle-Dot 0.1 mg/24 hr transdermal patch     famotidine (PEPCID) 10 MG tablet Take 10 mg by mouth 2 (two) times daily.     fexofenadine (ALLEGRA ALLERGY) 180 MG tablet Take 1 tablet (180 mg total) by mouth daily for 15 days. 15 tablet 0   ibuprofen (ADVIL,MOTRIN) 200 MG tablet Take 200 mg by mouth every 6 (six) hours as needed.      Multiple Vitamins-Minerals (CENTRUM SILVER PO) Take 1 tablet by mouth daily.     cyclobenzaprine (FLEXERIL) 5 MG tablet Take 1 tablet (5 mg total) by mouth at bedtime as needed for muscle spasms. 30 tablet 0   No facility-administered medications prior to visit.     Allergies:   Patient has no known allergies.   Social History   Socioeconomic History   Marital status: Married    Spouse name: Not on file   Number of children: Not on file   Years of education: Not on file   Highest education level: Not on file  Occupational History   Not on file  Tobacco Use   Smoking status: Never   Smokeless tobacco: Never  Substance and Sexual Activity   Alcohol use: Never   Drug use: Never   Sexual activity: Not Currently  Other Topics Concern   Not on file  Social History Narrative   Not on file   Social Determinants of  Health   Financial Resource Strain: Not on file  Food Insecurity: Not on file  Transportation Needs: Not on file  Physical Activity: Not on file  Stress: Not on file  Social Connections: Not on file    Additional social history is notable in that 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  She is married for 50 years and has 2 children, [place] age 39 and Will age 39 who is now a Pharmacist, community.  She exercises at least 3 days/week for 30 to 45 minutes.  There is no tobacco or alcohol use.  Family History:  The patient's family history includes Asthma in her maternal grandfather and maternal grandmother; Breast cancer in her maternal  aunt and mother; Cancer in her mother and paternal grandmother; Cerebral palsy in her brother; Depression in her mother; Diabetes in her father; Heart disease in her paternal grandfather and paternal grandmother; Hyperlipidemia in her father; Hypertension in her father and paternal grandfather.   Her mother died at age 17 and had Sjogren's disease and pulmonary fibrosis.  Her father died at age 55 with a heart attack.  A paternal grandfather had an aortic aneurysm.  She has a brother with cerebral palsy.  ROS General: Negative; No fevers, chills, or night sweats;  HEENT: Negative; No changes in vision or hearing, sinus congestion, difficulty swallowing Pulmonary: Negative; No cough, wheezing, shortness of breath, hemoptysis Cardiovascular: See HPI GI: GERD on famotidine. GU: Negative; No dysuria, hematuria, or difficulty voiding Musculoskeletal: Negative; no myalgias, joint pain, or weakness Hematologic/Oncology: Negative; no easy bruising, bleeding Endocrine: Negative; no heat/cold intolerance; no diabetes Neuro: Negative; no changes in balance, headaches Skin: Negative; No rashes or skin lesions Psychiatric: Negative; No behavioral problems, depression Sleep: Negative; No snoring, daytime sleepiness, hypersomnolence, bruxism, restless legs, hypnogognic hallucinations, no cataplexy Other comprehensive 14 point system review is negative.   PHYSICAL EXAM:   VS:  BP 116/68   Pulse 60   Ht $R'5\' 4"'XB$  (1.626 m)   Wt 141 lb 3.2 oz (64 kg)   SpO2 99%   BMI 24.24 kg/m     Repeat blood pressure by me was 115/70.  Wt Readings from Last 3 Encounters:  11/08/20 141 lb 3.2 oz (64 kg)  09/06/20 137 lb (62.1 kg)  08/23/20 140 lb 12.8 oz (63.9 kg)    General: Alert, oriented, no distress.  Skin: normal turgor, no rashes, warm and dry HEENT: Normocephalic, atraumatic. Pupils equal round and reactive to light; sclera anicteric; extraocular muscles intact;  Nose without nasal septal  hypertrophy Mouth/Parynx benign; Mallinpatti scale 2 Neck: No JVD, no carotid bruits; normal carotid upstroke Lungs: clear to ausculatation and percussion; no wheezing or rales Chest wall: without tenderness to palpitation Heart: PMI not displaced, RRR, s1 s2 normal, faint 1/6 systolic murmur, no diastolic murmur, no rubs, gallops, thrills, or heaves Abdomen: soft, nontender; no hepatosplenomehaly, BS+; abdominal aorta nontender and not dilated by palpation. Back: no CVA tenderness Pulses 2+ Musculoskeletal: full range of motion, normal strength, no joint deformities Extremities: no clubbing cyanosis or edema, Homan's sign negative  Neurologic: grossly nonfocal; Cranial nerves grossly wnl Psychologic: Normal mood and affect   Studies/Labs Reviewed:   EKG:  EKG is ordered today. ECG (independently read by me): Normal sinus rhythm at 60 bpm, right bundle branch block with repolarization changes, QRS duration 124 ms.  No ectopy.  I personally reviewed the ECG from 08/23/2020 which showed Sinus bradycardia at 55, RBBB with repolarization  changes.  Recent Labs: BMP Latest Ref Rng & Units 08/21/2020 09/30/2018 02/16/2018  Glucose 70 - 99 mg/dL 89 99 93  BUN 6 - 23 mg/dL _0 Creatinine 0.40 - 1.20 mg/dL 0.76 0.78 0.82  BUN/Creat Ratio 12 - 28 - 10(L) 10  Sodium 135 - 145 mEq/L 135 139 140  Potassium 3.5 - 5.1 mEq/L 3.8 4.2 4.6  Chloride 96 - 112 mEq/L 101 103 101  CO2 19 - 32 mEq/L _1 Calcium 8.4 - 10.5 mg/dL 9.0 9.4 9.6     Hepatic Function Latest Ref Rng & Units 08/21/2020 09/30/2018 02/16/2018  Total Protein 6.0 - 8.3 g/dL 6.5 6.8 6.9  Albumin 3.5 - 5.2 g/dL 4.2 4.5 4.5  AST 0 - 37 U/L _2 ALT 0 - 35 U/L _3 Alk Phosphatase 39 - 117 U/L 51 60 62  Total Bilirubin 0.2 - 1.2 mg/dL 0.7 0.5 0.6    CBC Latest Ref Rng & Units 08/21/2020 09/30/2018 05/06/2017  WBC 4.0 - 10.5 K/uL 6.1 7.0 6.1  Hemoglobin 12.0 - 15.0 g/dL 12.7 13.3 13.0  Hematocrit 36.0 - 46.0 % 36.6  39.6 38.5  Platelets 150.0 - 400.0 K/uL 251.0 274 241   Lab Results  Component Value Date   MCV 91.5 08/21/2020   MCV 89 09/30/2018   MCV 91 05/06/2017   Lab Results  Component Value Date   TSH 1.15 08/23/2020   Lab Results  Component Value Date   HGBA1C 5.7 08/21/2020     BNP No results found for: BNP  ProBNP No results found for: PROBNP   Lipid Panel     Component Value Date/Time   CHOL 184 08/21/2020 0817   CHOL 215 (H) 09/30/2018 0902   TRIG 49.0 08/21/2020 0817   HDL 83.20 08/21/2020 0817   HDL 97 09/30/2018 0902   CHOLHDL 2 08/21/2020 0817   VLDL 9.8 08/21/2020 0817   LDLCALC 91 08/21/2020 0817   LDLCALC 106 (H) 09/30/2018 0902   LABVLDL 12 09/30/2018 0902     RADIOLOGY: No results found.   Additional studies/ records that were reviewed today include:  I have reviewed the records of Dr. Carlota Raspberry    ASSESSMENT:    1. Right bundle branch block   2. 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  She has recently been documented to have a right bundle branch block on her ECG.  She has a history of rare to occasional palpitations and rarely has used alprazolan for mild anxiety. In 2020 her ECG demonstrated mild RV conduction delay which did not meet criteria for RBBB. Her ECG in May 2022 and today now reveals RBBB with repolarization changes.  She exercises fairly regularly and denies and chest pain, exertional dyspnea, pre-syncope or syncope. Her exam is essentially normal. I have recommended that she undergo a baseline echo-doppler evaluation to assess LV systolic and diastolic function, chamber dimensions and valvular architecture. I have reviewed her laboratory from May 2022 from Dr. Carlota Raspberry, TSH was normal at 1.15. Lipid studies are excellent with TC 184, TG 49, LAD 91 and HDL 83. CBC and CMET are stable and HbA1c 5.7. I reassured her that her RBBB is not indicative of CAD and there is no need  for a pacemaker. I encouraged her to remain active without cardiac restriction. I will contact her regarding her echo results and further recommendations will be  made at that time.    Medication Adjustments/Labs and Tests Ordered: Current medicines are reviewed at length with the patient today.  Concerns regarding medicines are outlined above.  Medication changes, Labs and Tests ordered today are listed in the Patient Instructions below. Patient Instructions  Medication Instructions:  Your physician recommends that you continue on your current medications as directed. Please refer to the Current Medication list given to you today.  *If you need a refill on your cardiac medications before your next appointment, please call your pharmacy*   Lab Work: None ordered.    Testing/Procedures:  Your physician has requested that you have an echocardiogram. Echocardiography is a painless test that uses sound waves to create images of your heart. It provides your doctor with information about the size and shape of your heart and how well your heart's chambers and valves are working. This procedure takes approximately one hour. There are no restrictions for this procedure.    Follow-Up: At [facility], you and your health needs are our priority.  As part of our continuing mission to provide you with exceptional heart care, we have created designated Provider Care Teams.  These Care Teams include your primary Cardiologist (physician) and Advanced Practice Providers (APPs -  Physician Assistants and Nurse Practitioners) who all work together to provide you with the care you need, when you need it.  We recommend signing up for the patient portal called "MyChart".  Sign up information is provided on this After Visit Summary.  MyChart is used to connect with patients for Virtual Visits (Telemedicine).  Patients are able to view lab/test results, encounter notes, upcoming appointments, etc.  Non-urgent  messages can be sent to your provider as well.   To learn more about what you can do with MyChart, go to [email or web address].    Your next appointment:   Follow up as directed following your echocardiogram.   The format for your next appointment:   In Person  Provider:   Shelva Majestic, MD      Signed, Shelva Majestic, MD  11/11/2020 3:30 PM    [facility] [address] [place], [place], [place]  [address] Phone: [phone]

## 2020-11-11 ENCOUNTER — Encounter: Payer: Self-pay | Admitting: Cardiovascular Disease

## 2020-11-16 DIAGNOSIS — H04123 Dry eye syndrome of bilateral lacrimal glands: Secondary | ICD-10-CM | POA: Diagnosis not present

## 2020-11-16 DIAGNOSIS — H524 Presbyopia: Secondary | ICD-10-CM | POA: Diagnosis not present

## 2020-11-16 DIAGNOSIS — H2513 Age-related nuclear cataract, bilateral: Secondary | ICD-10-CM | POA: Diagnosis not present

## 2020-11-30 ENCOUNTER — Other Ambulatory Visit: Payer: Self-pay

## 2020-11-30 ENCOUNTER — Ambulatory Visit (HOSPITAL_COMMUNITY): Payer: BC Managed Care – PPO | Attending: Cardiovascular Disease

## 2020-11-30 DIAGNOSIS — I4510 Unspecified right bundle-branch block: Secondary | ICD-10-CM | POA: Insufficient documentation

## 2020-11-30 LAB — ECHOCARDIOGRAM COMPLETE
Area-P 1/2: 2.48 cm2
S' Lateral: 2.4 cm

## 2020-12-21 ENCOUNTER — Ambulatory Visit: Payer: BC Managed Care – PPO

## 2020-12-28 DIAGNOSIS — R102 Pelvic and perineal pain: Secondary | ICD-10-CM | POA: Diagnosis not present

## 2021-01-05 ENCOUNTER — Ambulatory Visit
Admission: RE | Admit: 2021-01-05 | Discharge: 2021-01-05 | Disposition: A | Discharge status: Home / Self Care | Payer: BC Managed Care – PPO | Source: Ambulatory Visit | Attending: Obstetrics & Gynecology | Admitting: Obstetrics & Gynecology

## 2021-01-05 ENCOUNTER — Other Ambulatory Visit: Payer: Self-pay

## 2021-01-05 DIAGNOSIS — Z1231 Encounter for screening mammogram for malignant neoplasm of breast: Secondary | ICD-10-CM

## 2021-01-18 DIAGNOSIS — R1030 Lower abdominal pain, unspecified: Secondary | ICD-10-CM | POA: Diagnosis not present

## 2021-01-18 DIAGNOSIS — R197 Diarrhea, unspecified: Secondary | ICD-10-CM | POA: Diagnosis not present

## 2021-01-22 DIAGNOSIS — R109 Unspecified abdominal pain: Secondary | ICD-10-CM | POA: Diagnosis not present

## 2021-01-22 DIAGNOSIS — R197 Diarrhea, unspecified: Secondary | ICD-10-CM | POA: Diagnosis not present

## 2021-04-20 ENCOUNTER — Ambulatory Visit: Payer: BC Managed Care – PPO | Admitting: Registered Nurse

## 2021-04-20 ENCOUNTER — Encounter: Payer: Self-pay | Admitting: Registered Nurse

## 2021-04-20 VITALS — BP 112/74 | HR 65 | Temp 98.7°F | Resp 14 | Ht 63.5 in | Wt 141.4 lb

## 2021-04-20 DIAGNOSIS — J019 Acute sinusitis, unspecified: Secondary | ICD-10-CM

## 2021-04-20 DIAGNOSIS — B9689 Other specified bacterial agents as the cause of diseases classified elsewhere: Secondary | ICD-10-CM

## 2021-04-20 MED ORDER — AMOXICILLIN-POT CLAVULANATE 875-125 MG PO TABS: 1.0000 | ORAL_TABLET | Freq: Two times a day (BID) | ORAL | 0 refills | Status: DC

## 2021-04-20 MED ORDER — PREDNISONE 10 MG (21) PO TBPK: ORAL_TABLET | ORAL | 0 refills | Status: DC

## 2021-04-20 MED ORDER — BENZONATATE 200 MG PO CAPS: 200.0000 mg | ORAL_CAPSULE | Freq: Two times a day (BID) | ORAL | 0 refills | Status: DC | PRN

## 2021-04-20 MED ORDER — ALBUTEROL SULFATE HFA 108 (90 BASE) MCG/ACT IN AERS: 2.0000 | INHALATION_SPRAY | Freq: Four times a day (QID) | RESPIRATORY_TRACT | 0 refills | Status: DC | PRN

## 2021-04-20 NOTE — Patient Instructions (Signed)
[name] to meet you  Take antibiotics as prescribed  Ok to continue OTC meds  Refilled albuterol and tessalon for you  Call on Tuesday if not getting any better.  Thank you!  Rich

## 2021-04-23 NOTE — Progress Notes (Signed)
Established Patient Office Visit  Subjective:  Patient ID: Karen Pineda, female    DOB: 06/17/1960  Age: 61 y.o. MRN: [id number]  CC:  Chief Complaint  Patient presents with   Sinus Problem    Headache, cough x 10 days. Has improved but still not feeling well. Concern for sinus infection    HPI Karen Pineda presents for sinus congestion  Onset 10 days. Improved on days 5-6 then worsened again.  Malaise, cough, congestion, headache. No shob, doe, chest pain, nvd, fevers  No sick contacts.  Past Medical History:  Diagnosis Date   Allergy    seasonal   Anxiety    occasional   GERD (gastroesophageal reflux disease)    occasional    Past Surgical History:  Procedure Laterality Date   ABDOMINAL HYSTERECTOMY      Family History  Problem Relation Age of Onset   Cancer Mother        breast   Depression Mother    Breast cancer Mother    Diabetes Father    Hyperlipidemia Father    Hypertension Father    Cerebral palsy Brother    Asthma Maternal Grandmother    Asthma Maternal Grandfather    Cancer Paternal Grandmother        breast   Heart disease Paternal Grandmother    Heart disease Paternal Grandfather    Hypertension Paternal Grandfather    Breast cancer Maternal Aunt     Social History   Socioeconomic History   Marital status: Married    Spouse name: Not on file   Number of children: Not on file   Years of education: Not on file   Highest education level: Not on file  Occupational History   Not on file  Tobacco Use   Smoking status: Never   Smokeless tobacco: Never  Substance and Sexual Activity   Alcohol use: Never   Drug use: Never   Sexual activity: Not Currently  Other Topics Concern   Not on file  Social History Narrative   Not on file   Social Determinants of Health   Financial Resource Strain: Not on file  Food Insecurity: Not on file  Transportation Needs: Not on file  Physical Activity: Not on file  Stress: Not on  file  Social Connections: Not on file  Intimate Partner Violence: Not on file    Outpatient Medications Prior to Visit  Medication Sig Dispense Refill   ALPRAZolam (XANAX) 0.25 MG tablet Take 0.125 mg by mouth daily as needed for anxiety.      estradiol (VIVELLE-DOT) 0.1 MG/24HR patch Vivelle-Dot 0.1 mg/24 hr transdermal patch     famotidine (PEPCID) 10 MG tablet Take 10 mg by mouth 2 (two) times daily.     ibuprofen (ADVIL,MOTRIN) 200 MG tablet Take 200 mg by mouth every 6 (six) hours as needed.      Multiple Vitamins-Minerals (CENTRUM SILVER PO) Take 1 tablet by mouth daily.     fexofenadine (ALLEGRA ALLERGY) 180 MG tablet Take 1 tablet (180 mg total) by mouth daily for 15 days. 15 tablet 0   No facility-administered medications prior to visit.    No Known Allergies  ROS Review of Systems Per hpi     Objective:    Physical Exam Vitals and nursing note reviewed.  Constitutional:      General: She is not in acute distress.    Appearance: Normal appearance. She is normal weight. She is not ill-appearing, toxic-appearing or diaphoretic.  HENT:  Nose: Congestion and rhinorrhea present.     Mouth/Throat:     Pharynx: No oropharyngeal exudate.  Cardiovascular:     Rate and Rhythm: Normal rate and regular rhythm.     Heart sounds: Normal heart sounds. No murmur heard.   No friction rub. No gallop.  Pulmonary:     Effort: Pulmonary effort is normal. No respiratory distress.     Breath sounds: No stridor. Wheezing present. No rhonchi or rales.  Chest:     Chest wall: No tenderness.  Skin:    General: Skin is warm and dry.  Neurological:     General: No focal deficit present.     Mental Status: She is alert and oriented to person, place, and time. Mental status is at baseline.  Psychiatric:        Mood and Affect: Mood normal.        Behavior: Behavior normal.        Thought Content: Thought content normal.        Judgment: Judgment normal.    BP 112/74    Pulse 65     Temp 98.7 F (37.1 C) (Oral)    Resp 14    Ht 5' 3.5" (1.613 m)    Wt 141 lb 6.4 oz (64.1 kg)    SpO2 95%    BMI 24.65 kg/m  Wt Readings from Last 3 Encounters:  04/20/21 141 lb 6.4 oz (64.1 kg)  11/08/20 141 lb 3.2 oz (64 kg)  09/06/20 137 lb (62.1 kg)     There are no preventive care reminders to display for this patient.  There are no preventive care reminders to display for this patient.  Lab Results  Component Value Date   TSH 1.15 08/23/2020   Lab Results  Component Value Date   WBC 6.1 08/21/2020   HGB 12.7 08/21/2020   HCT 36.6 08/21/2020   MCV 91.5 08/21/2020   PLT 251.0 08/21/2020   Lab Results  Component Value Date   NA 135 08/21/2020   K 3.8 08/21/2020   CO2 27 08/21/2020   GLUCOSE 89 08/21/2020   BUN 10 08/21/2020   CREATININE 0.76 08/21/2020   BILITOT 0.7 08/21/2020   ALKPHOS 51 08/21/2020   AST 22 08/21/2020   ALT 14 08/21/2020   PROT 6.5 08/21/2020   ALBUMIN 4.2 08/21/2020   CALCIUM 9.0 08/21/2020   GFR 84.21 08/21/2020   Lab Results  Component Value Date   CHOL 184 08/21/2020   Lab Results  Component Value Date   HDL 83.20 08/21/2020   Lab Results  Component Value Date   LDLCALC 91 08/21/2020   Lab Results  Component Value Date   TRIG 49.0 08/21/2020   Lab Results  Component Value Date   CHOLHDL 2 08/21/2020   Lab Results  Component Value Date   HGBA1C 5.7 08/21/2020      Assessment & Plan:   Problem List Items Addressed This Visit   None Visit Diagnoses     Acute bacterial sinusitis    -  Primary   Relevant Medications   amoxicillin-clavulanate (AUGMENTIN) 875-125 MG tablet   benzonatate (TESSALON) 200 MG capsule   albuterol (VENTOLIN HFA) 108 (90 Base) MCG/ACT inhaler   predniSONE (STERAPRED UNI-PAK 21 TAB) 10 MG (21) TBPK tablet       Meds ordered this encounter  Medications   amoxicillin-clavulanate (AUGMENTIN) 875-125 MG tablet    Sig: Take 1 tablet by mouth 2 (two) times daily.    Dispense:  20 tablet     Refill:  0    Order Specific Question:   Supervising Provider    Answer:   Carlota Raspberry, JEFFREY R [[id number]]   benzonatate (TESSALON) 200 MG capsule    Sig: Take 1 capsule (200 mg total) by mouth 2 (two) times daily as needed for cough.    Dispense:  20 capsule    Refill:  0    Order Specific Question:   Supervising Provider    Answer:   Carlota Raspberry, JEFFREY R [[id number]]   albuterol (VENTOLIN HFA) 108 (90 Base) MCG/ACT inhaler    Sig: Inhale 2 puffs into the lungs every 6 (six) hours as needed for wheezing or shortness of breath.    Dispense:  8 g    Refill:  0    Order Specific Question:   Supervising Provider    Answer:   Carlota Raspberry, JEFFREY R [[id number]]   predniSONE (STERAPRED UNI-PAK 21 TAB) 10 MG (21) TBPK tablet    Sig: Take per package instructions. Do not skip doses. Finish entire supply.    Dispense:  1 each    Refill:  0    Order Specific Question:   Supervising Provider    Answer:   Carlota Raspberry, JEFFREY R [[id number]]    Follow-up: No follow-ups on file.   PLAN Pattern follows bacterial infection. Tx with augmentin Supportive care with tessalon and prednisone. Will give albuterol given some wheezing on exam Return if worsening or failing to improve Patient encouraged to call clinic with any questions, comments, or concerns.  Maximiano Coss, NP

## 2021-05-05 ENCOUNTER — Telehealth: Payer: BC Managed Care – PPO | Admitting: Family

## 2021-05-05 DIAGNOSIS — H109 Unspecified conjunctivitis: Secondary | ICD-10-CM

## 2021-05-05 MED ORDER — POLYMYXIN B-TRIMETHOPRIM 10000-0.1 UNIT/ML-% OP SOLN: 1.0000 [drp] | Freq: Four times a day (QID) | OPHTHALMIC | 0 refills | Status: DC

## 2021-05-05 NOTE — Progress Notes (Signed)
Virtual Visit Consent   [name], you are scheduled for a virtual visit with a [facility] provider today.     Just as with appointments in the office, your consent must be obtained to participate.  Your consent will be active for this visit and any virtual visit you may have with one of our providers in the next 365 days.     If you have a MyChart account, a copy of this consent can be sent to you electronically.  All virtual visits are billed to your insurance company just like a traditional visit in the office.    As this is a virtual visit, video technology does not allow for your provider to perform a traditional examination.  This may limit your provider's ability to fully assess your condition.  If your provider identifies any concerns that need to be evaluated in person or the need to arrange testing (such as labs, EKG, etc.), we will make arrangements to do so.     Although advances in technology are sophisticated, we cannot ensure that it will always work on either your end or our end.  If the connection with a video visit is poor, the visit may have to be switched to a telephone visit.  With either a video or telephone visit, we are not always able to ensure that we have a secure connection.     I need to obtain your verbal consent now.   Are you willing to proceed with your visit today?    [name] has provided verbal consent on 05/05/2021 for a virtual visit (video or telephone).   [name], FNP   Date: 05/05/2021 8:48 AM   Virtual Visit via Video Note   I, [name], connected with  Karen Pineda  ([id number], [birth year]) on 05/05/21 at  8:45 AM EST by a video-enabled telemedicine application and verified that I am speaking with the correct person using two identifiers.  Location: Patient: Virtual Visit Location Patient: Home Provider: Virtual Visit Location Provider: Home Office   I discussed the limitations of evaluation and management by  telemedicine and the availability of in person appointments. The patient expressed understanding and agreed to proceed.    History of Present Illness: Karen Pineda is a 61 y.o. who identifies as a female who was assigned female at birth, and is being seen today for pink eye.  HPI: Conjunctivitis  The current episode started today. The onset was sudden. The problem occurs continuously. The problem has been gradually worsening. The problem is moderate. Associated symptoms include eye itching, photophobia, eye discharge, eye pain and eye redness. Pertinent negatives include no decreased vision, no double vision, no ear discharge, no ear pain, no headaches, no hearing loss, no rhinorrhea and no sore throat. The right eye is affected.   Problems:  Patient Active Problem List   Diagnosis Date Noted   DDD (degenerative disc disease), lumbar 08/18/2017   Situational anxiety 08/18/2017   Ankle pain 12/11/2011    Allergies: No Known Allergies Medications:  Current Outpatient Medications:    trimethoprim-polymyxin b (POLYTRIM) ophthalmic solution, Place 1 drop into the right eye every 6 (six) hours., Disp: 10 mL, Rfl: 0   albuterol (VENTOLIN HFA) 108 (90 Base) MCG/ACT inhaler, Inhale 2 puffs into the lungs every 6 (six) hours as needed for wheezing or shortness of breath., Disp: 8 g, Rfl: 0   ALPRAZolam (XANAX) 0.25 MG tablet, Take 0.125 mg by mouth daily as needed for anxiety. ,  Disp: , Rfl:    amoxicillin-clavulanate (AUGMENTIN) 875-125 MG tablet, Take 1 tablet by mouth 2 (two) times daily., Disp: 20 tablet, Rfl: 0   benzonatate (TESSALON) 200 MG capsule, Take 1 capsule (200 mg total) by mouth 2 (two) times daily as needed for cough., Disp: 20 capsule, Rfl: 0   estradiol (VIVELLE-DOT) 0.1 MG/24HR patch, Vivelle-Dot 0.1 mg/24 hr transdermal patch, Disp: , Rfl:    famotidine (PEPCID) 10 MG tablet, Take 10 mg by mouth 2 (two) times daily., Disp: , Rfl:    fexofenadine (ALLEGRA ALLERGY) 180 MG  tablet, Take 1 tablet (180 mg total) by mouth daily for 15 days., Disp: 15 tablet, Rfl: 0   ibuprofen (ADVIL,MOTRIN) 200 MG tablet, Take 200 mg by mouth every 6 (six) hours as needed. , Disp: , Rfl:    Multiple Vitamins-Minerals (CENTRUM SILVER PO), Take 1 tablet by mouth daily., Disp: , Rfl:    predniSONE (STERAPRED UNI-PAK 21 TAB) 10 MG (21) TBPK tablet, Take per package instructions. Do not skip doses. Finish entire supply., Disp: 1 each, Rfl: 0  Observations/Objective: Patient is well-developed, well-nourished in no acute distress.  Resting comfortably  at home.  Head is normocephalic, atraumatic.  No labored breathing.  Speech is clear and coherent with logical content.  Patient is alert and oriented at baseline.  Right eye slightly erythemas    Assessment and Plan: 1. Bacterial conjunctivitis - trimethoprim-polymyxin b (POLYTRIM) ophthalmic solution; Place 1 drop into the right eye every 6 (six) hours.  Dispense: 10 mL; Refill: 0  Good hand hygiene  Avoid rubbing eye  Warm hot compresses  Follow up if symptoms worsen or not improve   Follow Up Instructions: I discussed the assessment and treatment plan with the patient. The patient was provided an opportunity to ask questions and all were answered. The patient agreed with the plan and demonstrated an understanding of the instructions.  A copy of instructions were sent to the patient via MyChart unless otherwise noted below.    The patient was advised to call back or seek an in-person evaluation if the symptoms worsen or if the condition fails to improve as anticipated.  Time:  I spent 7 minutes with the patient via telehealth technology discussing the above problems/concerns.    [name], FNP

## 2021-05-08 DIAGNOSIS — L853 Xerosis cutis: Secondary | ICD-10-CM | POA: Diagnosis not present

## 2021-05-08 DIAGNOSIS — L821 Other seborrheic keratosis: Secondary | ICD-10-CM | POA: Diagnosis not present

## 2021-05-08 DIAGNOSIS — L245 Irritant contact dermatitis due to other chemical products: Secondary | ICD-10-CM | POA: Diagnosis not present

## 2021-05-08 DIAGNOSIS — L578 Other skin changes due to chronic exposure to nonionizing radiation: Secondary | ICD-10-CM | POA: Diagnosis not present

## 2021-05-08 DIAGNOSIS — L570 Actinic keratosis: Secondary | ICD-10-CM | POA: Diagnosis not present

## 2021-06-26 ENCOUNTER — Other Ambulatory Visit: Payer: Self-pay | Admitting: Obstetrics & Gynecology

## 2021-06-26 DIAGNOSIS — Z803 Family history of malignant neoplasm of breast: Secondary | ICD-10-CM

## 2021-06-28 ENCOUNTER — Telehealth: Payer: BC Managed Care – PPO | Admitting: Family Medicine

## 2021-06-28 DIAGNOSIS — H109 Unspecified conjunctivitis: Secondary | ICD-10-CM | POA: Diagnosis not present

## 2021-06-28 MED ORDER — POLYMYXIN B-TRIMETHOPRIM 10000-0.1 UNIT/ML-% OP SOLN: 1.0000 [drp] | Freq: Four times a day (QID) | OPHTHALMIC | 0 refills | Status: DC

## 2021-06-28 NOTE — Patient Instructions (Signed)
Bacterial Conjunctivitis, Adult ?Bacterial conjunctivitis is an infection of the clear membrane that covers the white part of the eye and the inner surface of the eyelid (conjunctiva). When the blood vessels in the conjunctiva become inflamed, the eye becomes red or pink. The eye often feels irritated or itchy. Bacterial conjunctivitis spreads easily from person to person (is contagious). It also spreads easily from one eye to the other eye. ?What are the causes? ?This condition is caused by bacteria. You may get the infection if you come into close contact with: ?A person who is infected with the bacteria. ?Items that are contaminated with the bacteria, such as a face towel, contact lens solution, or eye makeup. ?What increases the risk? ?You are more likely to develop this condition if: ?You are exposed to other people who have the infection. ?You wear contact lenses. ?You have a sinus infection. ?You have had a recent eye injury or surgery. ?You have a weak body defense system (immune system). ?You have a medical condition that causes dry eyes. ?What are the signs or symptoms? ?Symptoms of this condition include: ?Thick, yellowish discharge from the eye. This may turn into a crust on the eyelid overnight and cause your eyelids to stick together. ?Tearing or watery eyes. ?Itchy eyes. ?Burning feeling in your eyes. ?Eye redness. ?Swollen eyelids. ?Blurred vision. ?How is this diagnosed? ?This condition is diagnosed based on your symptoms and medical history. Your health care provider may also take a sample of discharge from your eye to find the cause of your infection. ?How is this treated? ?This condition may be treated with: ?Antibiotic eye drops or ointment to clear the infection more quickly and prevent the spread of infection to others. ?Antibiotic medicines taken by mouth (orally) to treat infections that do not respond to drops or ointments or that last longer than 10 days. ?Cool, wet cloths (cool  compresses) placed on the eyes. ?Artificial tears applied 2-6 times a day. ?Follow these instructions at home: ?Medicines ?Take or apply your antibiotic medicine as told by your health care provider. Do not stop using the antibiotic, even if your condition improves, unless directed by your health care provider. ?Take or apply over-the-counter and prescription medicines only as told by your health care provider. ?Be very careful to avoid touching the edge of your eyelid with the eye-drop bottle or the ointment tube when you apply medicines to the affected eye. This will keep you from spreading the infection to your other eye or to other people. ?Managing discomfort ?Gently wipe away any drainage from your eye with a warm, wet washcloth or a cotton ball. ?Apply a clean, cool compress to your eye for 10-20 minutes, 3-4 times a day. ?General instructions ?Do not wear contact lenses until the inflammation is gone and your health care provider says it is safe to wear them again. Ask your health care provider how to sterilize or replace your contact lenses before you use them again. Wear glasses until you can resume wearing contact lenses. ?Avoid wearing eye makeup until the inflammation is gone. Throw away any old eye cosmetics that may be contaminated. ?Change or wash your pillowcase every day. ?Do not share towels or washcloths. This may spread the infection. ?Wash your hands often with soap and water for at least 20 seconds and especially before touching your face or eyes. Use paper towels to dry your hands. ?Avoid touching or rubbing your eyes. ?Do not drive or use heavy machinery if your vision is blurred. ?Contact   a health care provider if: ?You have a fever. ?Your symptoms do not get better after 10 days. ?Get help right away if: ?You have a fever and your symptoms suddenly get worse. ?You have severe pain when you move your eye. ?You have facial pain, redness, or swelling. ?You have a sudden loss of  vision. ?Summary ?Bacterial conjunctivitis is an infection of the clear membrane that covers the white part of the eye and the inner surface of the eyelid (conjunctiva). ?Bacterial conjunctivitis spreads easily from eye to eye and from person to person (is contagious). ?Wash your hands often with soap and water for at least 20 seconds and especially before touching your face or eyes. Use paper towels to dry your hands. ?Take or apply your antibiotic medicine as told by your health care provider. Do not stop using the antibiotic even if your condition improves. ?Contact a health care provider if you have a fever or if your symptoms do not get better after 10 days. Get help right away if you have a sudden loss of vision. ?This information is not intended to replace advice given to you by your health care provider. Make sure you discuss any questions you have with your health care provider. ?Document Revised: 07/05/2020 Document Reviewed: 07/05/2020 ?Elsevier Patient Education ? [place]. ?Allergies can cause a lot of symptoms: watery, itching eyes, runny nose (clear), sneezing, sinus pressure, and headaches. This is not making you contagious to others. You can not spread to others or catch this from others. These symptoms happen after you have been exposed to something that you are allergic to an allergen. ?Prevention: The best prevention is to avoid the things that you know you are allergic to, for example smoke (cigarette, cigar, wood); pollens and molds; animal dander; dust mites. And indoor inhalants such as cleaning products or aerosol sprays.  ?Target your bedroom as allergy free by removing carpets, damp mopping floors weekly, hanging washable curtains instead of blinds, removing books and stuffed animals, using foam pillows, and encasing pillows and mattress in plastic. ?Do not blow your nose too frequently or too hard. It may cause your eardrum to perforate (tear). Blow through both nostrils at the  same time to equalize pressure.  ?Use tissue when you blow your nose. Dispose of them and then wash your hands. If no tissue is available, do the ?elbow sneeze? into the bend of your arm (away from your open hands). Always wash your hands.  ?If able use the [facility] in the house and car to reduce exposure to pollens. Use an air filtration system in your house or buy a small one for your bedroom. Dust your house often, using a cloth and cleaner or polish that keeps the dust from flying into the air. Allergy testing can be done if you have had allergies for a long time and are not doing well on current treatments. Take your medications as directed. If you find the medications are not working let your healthcare provider know. It might take more than one medication to control allergies, especially, seasonal ones.  ? ?

## 2021-06-28 NOTE — Progress Notes (Signed)
?Virtual Visit Consent  ? ?Karen Pineda, you are scheduled for a virtual visit with a [facility] provider today.   ?  ?Just as with appointments in the office, your consent must be obtained to participate.  Your consent will be active for this visit and any virtual visit you may have with one of our providers in the next 365 days.   ?  ?If you have a MyChart account, a copy of this consent can be sent to you electronically.  All virtual visits are billed to your insurance company just like a traditional visit in the office.   ? ?As this is a virtual visit, video technology does not allow for your provider to perform a traditional examination.  This may limit your provider's ability to fully assess your condition.  If your provider identifies any concerns that need to be evaluated in person or the need to arrange testing (such as labs, EKG, etc.), we will make arrangements to do so.   ?  ?Although advances in technology are sophisticated, we cannot ensure that it will always work on either your end or our end.  If the connection with a video visit is poor, the visit may have to be switched to a telephone visit.  With either a video or telephone visit, we are not always able to ensure that we have a secure connection.    ? ?I need to obtain your verbal consent now.   Are you willing to proceed with your visit today?  ?  ?[name] has provided verbal consent on 06/28/2021 for a virtual visit (video or telephone). ?  ?[name], NP  ? ?Date: 06/28/2021 7:54 AM ? ? ?Virtual Visit via Video Note  ? ?IPerlie Mayo, connected with  Karen Pineda  ([id number], [birth year]) on 06/28/21 at  7:45 AM EDT by a video-enabled telemedicine application and verified that I am speaking with the correct person using two identifiers. ? ?Location: ?Patient: Virtual Visit Location Patient: Home ?Provider: Virtual Visit Location Provider: Home Office ?  ?I discussed the limitations of evaluation and management  by telemedicine and the availability of in person appointments. The patient expressed understanding and agreed to proceed.   ? ?History of Present Illness: ?Karen Pineda is a 61 y.o. who identifies as a female who was assigned female at birth, and is being seen today for eye watering. ? ?Left eye started watery eye- sand paper feeling over the weekend, crusty on waking up. No pain, just discomfort. Now in both eyes. ?No recent colds, does have season allergies. Has tried polytrim drops back in Jan for similar issue. Has grandbaby on way wants to make sure she does not pass it along.  ? ? ?Problems:  ?Patient Active Problem List  ? Diagnosis Date Noted  ? DDD (degenerative disc disease), lumbar 08/18/2017  ? Situational anxiety 08/18/2017  ? Ankle pain 12/11/2011  ?  ?Allergies: No Known Allergies ?Medications:  ?Current Outpatient Medications:  ?  albuterol (VENTOLIN HFA) 108 (90 Base) MCG/ACT inhaler, Inhale 2 puffs into the lungs every 6 (six) hours as needed for wheezing or shortness of breath., Disp: 8 g, Rfl: 0 ?  ALPRAZolam (XANAX) 0.25 MG tablet, Take 0.125 mg by mouth daily as needed for anxiety. , Disp: , Rfl:  ?  amoxicillin-clavulanate (AUGMENTIN) 875-125 MG tablet, Take 1 tablet by mouth 2 (two) times daily., Disp: 20 tablet, Rfl: 0 ?  benzonatate (TESSALON) 200 MG capsule, Take 1 capsule (  200 mg total) by mouth 2 (two) times daily as needed for cough., Disp: 20 capsule, Rfl: 0 ?  estradiol (VIVELLE-DOT) 0.1 MG/24HR patch, Vivelle-Dot 0.1 mg/24 hr transdermal patch, Disp: , Rfl:  ?  famotidine (PEPCID) 10 MG tablet, Take 10 mg by mouth 2 (two) times daily., Disp: , Rfl:  ?  fexofenadine (ALLEGRA ALLERGY) 180 MG tablet, Take 1 tablet (180 mg total) by mouth daily for 15 days., Disp: 15 tablet, Rfl: 0 ?  ibuprofen (ADVIL,MOTRIN) 200 MG tablet, Take 200 mg by mouth every 6 (six) hours as needed. , Disp: , Rfl:  ?  Multiple Vitamins-Minerals (CENTRUM SILVER PO), Take 1 tablet by mouth daily., Disp: ,  Rfl:  ?  predniSONE (STERAPRED UNI-PAK 21 TAB) 10 MG (21) TBPK tablet, Take per package instructions. Do not skip doses. Finish entire supply., Disp: 1 each, Rfl: 0 ?  trimethoprim-polymyxin b (POLYTRIM) ophthalmic solution, Place 1 drop into the right eye every 6 (six) hours., Disp: 10 mL, Rfl: 0 ? ?Observations/Objective: ?Phone visit- inability to assess eyes ?No shortness of breath in communication ? ? ?Assessment and Plan: ?1. Bacterial conjunctivitis ? ?Ordered Polytrim Ophthalmic drops 1-2 drops 4 times a day times 5 days. ?AVS as review of allergy info as pink eye info ? ?- trimethoprim-polymyxin b (POLYTRIM) ophthalmic solution; Place 1 drop into the right eye every 6 (six) hours.  Dispense: 10 mL; Refill: 0 ? ? ?Follow Up Instructions: ?I discussed the assessment and treatment plan with the patient. The patient was provided an opportunity to ask questions and all were answered. The patient agreed with the plan and demonstrated an understanding of the instructions.  A copy of instructions were sent to the patient via MyChart unless otherwise noted below.  ? ?The patient was advised to call back or seek an in-person evaluation if the symptoms worsen or if the condition fails to improve as anticipated. ? ?Time:  ?I spent 10 minutes with the patient via telehealth technology discussing the above problems/concerns.   ? ?[name], NP ? ?

## 2021-07-12 ENCOUNTER — Other Ambulatory Visit: Payer: BC Managed Care – PPO

## 2021-08-29 ENCOUNTER — Ambulatory Visit
Admission: RE | Admit: 2021-08-29 | Discharge: 2021-08-29 | Disposition: A | Discharge status: Home / Self Care | Payer: BC Managed Care – PPO | Source: Ambulatory Visit | Attending: Obstetrics & Gynecology | Admitting: Obstetrics & Gynecology

## 2021-08-29 DIAGNOSIS — Z803 Family history of malignant neoplasm of breast: Secondary | ICD-10-CM | POA: Diagnosis not present

## 2021-08-29 DIAGNOSIS — Z1239 Encounter for other screening for malignant neoplasm of breast: Secondary | ICD-10-CM | POA: Diagnosis not present

## 2021-08-29 MED ORDER — GADOBUTROL 1 MMOL/ML IV SOLN
6.0000 mL | Freq: Once | INTRAVENOUS | Status: AC | PRN
  Administered 2021-08-29: 6 mL via INTRAVENOUS

## 2021-08-30 ENCOUNTER — Ambulatory Visit (INDEPENDENT_AMBULATORY_CARE_PROVIDER_SITE_OTHER): Payer: BC Managed Care – PPO | Admitting: Family Medicine

## 2021-08-30 ENCOUNTER — Encounter: Payer: Self-pay | Admitting: Family Medicine

## 2021-08-30 VITALS — BP 118/60 | HR 68 | Temp 97.9°F | Resp 16 | Ht 63.0 in | Wt 134.0 lb

## 2021-08-30 DIAGNOSIS — R7309 Other abnormal glucose: Secondary | ICD-10-CM | POA: Diagnosis not present

## 2021-08-30 DIAGNOSIS — M5136 Other intervertebral disc degeneration, lumbar region: Secondary | ICD-10-CM | POA: Diagnosis not present

## 2021-08-30 DIAGNOSIS — Z1329 Encounter for screening for other suspected endocrine disorder: Secondary | ICD-10-CM

## 2021-08-30 DIAGNOSIS — M51369 Other intervertebral disc degeneration, lumbar region without mention of lumbar back pain or lower extremity pain: Secondary | ICD-10-CM

## 2021-08-30 DIAGNOSIS — R002 Palpitations: Secondary | ICD-10-CM | POA: Diagnosis not present

## 2021-08-30 DIAGNOSIS — Z8349 Family history of other endocrine, nutritional and metabolic diseases: Secondary | ICD-10-CM

## 2021-08-30 DIAGNOSIS — Z13220 Encounter for screening for lipoid disorders: Secondary | ICD-10-CM

## 2021-08-30 DIAGNOSIS — Z0000 Encounter for general adult medical examination without abnormal findings: Secondary | ICD-10-CM | POA: Diagnosis not present

## 2021-08-30 LAB — CBC
HCT: 40.3 % (ref 36.0–46.0)
Hemoglobin: 13.5 g/dL (ref 12.0–15.0)
MCHC: 33.4 g/dL (ref 30.0–36.0)
MCV: 91.4 fl (ref 78.0–100.0)
Platelets: 279 10*3/uL (ref 150.0–400.0)
RBC: 4.41 Mil/uL (ref 3.87–5.11)
RDW: 12.7 % (ref 11.5–15.5)
WBC: 6.8 10*3/uL (ref 4.0–10.5)

## 2021-08-30 LAB — COMPREHENSIVE METABOLIC PANEL
ALT: 22 U/L (ref 0–35)
AST: 22 U/L (ref 0–37)
Albumin: 4.6 g/dL (ref 3.5–5.2)
Alkaline Phosphatase: 57 U/L (ref 39–117)
BUN: 9 mg/dL (ref 6–23)
CO2: 29 mEq/L (ref 19–32)
Calcium: 9.7 mg/dL (ref 8.4–10.5)
Chloride: 102 mEq/L (ref 96–112)
Creatinine, Ser: 0.77 mg/dL (ref 0.40–1.20)
GFR: 82.31 mL/min (ref >60.00)
Glucose, Bld: 91 mg/dL (ref 70–99)
Potassium: 4.3 mEq/L (ref 3.5–5.1)
Sodium: 138 mEq/L (ref 135–145)
Total Bilirubin: 0.5 mg/dL (ref 0.2–1.2)
Total Protein: 7.1 g/dL (ref 6.0–8.3)

## 2021-08-30 LAB — LIPID PANEL
Cholesterol: 216 mg/dL — ABNORMAL HIGH (ref 0–200)
HDL: 97 mg/dL (ref >39.00)
LDL Cholesterol: 107 mg/dL — ABNORMAL HIGH (ref 0–99)
NonHDL: 118.68
Total CHOL/HDL Ratio: 2
Triglycerides: 60 mg/dL (ref 0.0–149.0)
VLDL: 12 mg/dL (ref 0.0–40.0)

## 2021-08-30 LAB — HEMOGLOBIN A1C: Hgb A1c MFr Bld: 5.8 % (ref 4.6–6.5)

## 2021-08-30 LAB — TSH: TSH: 1.47 u[IU]/mL (ref 0.35–5.50)

## 2021-08-30 NOTE — Patient Instructions (Signed)
Thanks for coming in today.  I will check some labs.  If any more frequent palpitations please follow-up to discuss further.  I do not think there would be any specific restrictions with exercise other than to avoid heavy weight lifting, but I will refer you to a back specialist to discuss further.  Let me know if you do not hear from them in the next few weeks. I do recommend calcium at least 1200 to 1500 mg/day, vitamin D 1000 units /day.  Take care!  Preventive Care 74-3 Years Old, Female Preventive care refers to lifestyle choices and visits with your health care provider that can promote health and wellness. Preventive care visits are also called wellness exams. What can I expect for my preventive care visit? Counseling Your health care provider may ask you questions about your: Medical history, including: Past medical problems. Family medical history. Pregnancy history. Current health, including: Menstrual cycle. Method of birth control. Emotional well-being. Home life and relationship well-being. Sexual activity and sexual health. Lifestyle, including: Alcohol, nicotine or tobacco, and drug use. Access to firearms. Diet, exercise, and sleep habits. Work and work Statistician. Sunscreen use. Safety issues such as seatbelt and bike helmet use. Physical exam Your health care provider will check your: Height and weight. These may be used to calculate your BMI (body mass index). BMI is a measurement that tells if you are at a healthy weight. Waist circumference. This measures the distance around your waistline. This measurement also tells if you are at a healthy weight and may help predict your risk of certain diseases, such as type 2 diabetes and high blood pressure. Heart rate and blood pressure. Body temperature. Skin for abnormal spots. What immunizations do I need?  Vaccines are usually given at various ages, according to a schedule. Your health care provider will recommend  vaccines for you based on your age, medical history, and lifestyle or other factors, such as travel or where you work. What tests do I need? Screening Your health care provider may recommend screening tests for certain conditions. This may include: Lipid and cholesterol levels. Diabetes screening. This is done by checking your blood sugar (glucose) after you have not eaten for a while (fasting). Pelvic exam and Pap test. Hepatitis B test. Hepatitis C test. HIV (human immunodeficiency virus) test. STI (sexually transmitted infection) testing, if you are at risk. Lung cancer screening. Colorectal cancer screening. Mammogram. Talk with your health care provider about when you should start having regular mammograms. This may depend on whether you have a family history of breast cancer. BRCA-related cancer screening. This may be done if you have a family history of breast, ovarian, tubal, or peritoneal cancers. Bone density scan. This is done to screen for osteoporosis. Talk with your health care provider about your test results, treatment options, and if necessary, the need for more tests. Follow these instructions at home: Eating and drinking  Eat a diet that includes fresh fruits and vegetables, whole grains, lean protein, and low-fat dairy products. Take vitamin and mineral supplements as recommended by your health care provider. Do not drink alcohol if: Your health care provider tells you not to drink. You are pregnant, may be pregnant, or are planning to become pregnant. If you drink alcohol: Limit how much you have to 0-1 drink a day. Know how much alcohol is in your drink. In the [place] one drink equals one 12 oz bottle of beer (355 mL), one 5 oz glass of wine (148 mL), or one  1 oz glass of hard liquor (44 mL). Lifestyle Brush your teeth every morning and night with fluoride toothpaste. Floss one time each day. Exercise for at least 30 minutes 5 or more days each week. Do not use  any products that contain nicotine or tobacco. These products include cigarettes, chewing tobacco, and vaping devices, such as e-cigarettes. If you need help quitting, ask your health care provider. Do not use drugs. If you are sexually active, practice safe sex. Use a condom or other form of protection to prevent STIs. If you do not wish to become pregnant, use a form of birth control. If you plan to become pregnant, see your health care provider for a prepregnancy visit. Take aspirin only as told by your health care provider. Make sure that you understand how much to take and what form to take. Work with your health care provider to find out whether it is safe and beneficial for you to take aspirin daily. Find healthy ways to manage stress, such as: Meditation, yoga, or listening to music. Journaling. Talking to a trusted person. Spending time with friends and family. Minimize exposure to UV radiation to reduce your risk of skin cancer. Safety Always wear your seat belt while driving or riding in a vehicle. Do not drive: If you have been drinking alcohol. Do not ride with someone who has been drinking. When you are tired or distracted. While texting. If you have been using any mind-altering substances or drugs. Wear a helmet and other protective equipment during sports activities. If you have firearms in your house, make sure you follow all gun safety procedures. Seek help if you have been physically or sexually abused. What's next? Visit your health care provider once a year for an annual wellness visit. Ask your health care provider how often you should have your eyes and teeth checked. Stay up to date on all vaccines. This information is not intended to replace advice given to you by your health care provider. Make sure you discuss any questions you have with your health care provider. Document Revised: 09/20/2020 Document Reviewed: 09/20/2020 Elsevier Patient Education  [place].

## 2021-08-30 NOTE — Progress Notes (Signed)
Subjective:  Patient ID: Karen Pineda, female    DOB: 16-Feb-1961  Age: 61 y.o. MRN: [id number]  CC:  Chief Complaint  Patient presents with   Annual Exam    Pt here for annual exam, notes has had an XR of her back previously which showed abnormalities and wants to keep this from deteriorating more due to being a runner unsure if she should have MRI or wait until it does hurt,  Pt is fasting     HPI Karen Pineda presents for Annual Exam  Palpitations, right bundle branch block Evaluated by cardiology in August of last year.  No cardiac restrictions.  Echo in August 2022 with normal LV function, EF 60 to 65%, no wall motion abnormalities, normal diastolic parameters and normal valves per reading from cardiology. Rare palpitations - 2 times per week, fluttering feeling - few beats, no associated CP/dyspnea/diaphoresis. No changes.  Lab Results  Component Value Date   WBC 6.1 08/21/2020   HGB 12.7 08/21/2020   HCT 36.6 08/21/2020   MCV 91.5 08/21/2020   PLT 251.0 08/21/2020   Lab Results  Component Value Date   TSH 1.15 08/23/2020   Prediabetes: Has tried to improve diet. Weight improved.  Lab Results  Component Value Date   HGBA1C 5.7 08/21/2020   Wt Readings from Last 3 Encounters:  08/30/21 134 lb (60.8 kg)  04/20/21 141 lb 6.4 oz (64.1 kg)  11/08/20 141 lb 3.2 oz (64 kg)   Lumbar spine spondylosis Noted on imaging in May 2019 with severe degenerative disc disease with disc height loss at L4-5 and L5-S1.  Bilateral facet arthropathy at L5-S1.  Seen for episodic low back pain extending to the right leg at that time.  Recently has not had any back pain. She is a runner. Ran [place] half marathon last week. No issues. Feels better with exercise. Occasional soreness in leg resolves quickly.   No new sleep issues.     08/30/2021    9:02 AM 08/23/2020    2:38 PM 11/02/2018    8:09 AM 09/30/2018    8:10 AM 02/16/2018    8:20 AM  Depression screen PHQ 2/9   Decreased Interest 0 0 0 0 0  Down, Depressed, Hopeless 0 0 0 0 0  PHQ - 2 Score 0 0 0 0 0  Altered sleeping 1      Tired, decreased energy 1      Change in appetite 0      Feeling bad or failure about yourself  0      Trouble concentrating 0      Moving slowly or fidgety/restless 0      Suicidal thoughts 0      PHQ-9 Score 2        Health Maintenance  Topic Date Due   COVID-19 Vaccine (3 - Booster for Pfizer series) 10/13/2019   PAP SMEAR-Modifier  09/27/2021   INFLUENZA VACCINE  11/06/2021   MAMMOGRAM  01/06/2023   TETANUS/TDAP  08/19/2027   COLONOSCOPY (Pts 45-68yr Insurance coverage will need to be confirmed)  04/21/2029   Hepatitis C Screening  Completed   HIV Screening  Completed   Zoster Vaccines- Shingrix  Completed   HPV VACCINES  Aged Out  Colonoscopy: 04/2019 = Dr. MEarlean Shawl repeat 5 yrs.  Negative breast MRi yesterday -repeat mammogram, MRI in 1 year, family history of breast cancer. GYN - Dr. NNori Riis s/p hysterectomy.   Immunization History  Administered Date(s) Administered   Influenza,inj,Quad PF,6+  Mos 02/13/2017, 02/16/2018, 01/05/2019   Influenza-Unspecified 01/06/2021   PFIZER(Purple Top)SARS-COV-2 Vaccination 07/22/2019, 08/18/2019   PPD Test 02/16/2018   Tdap 08/18/2017   Zoster Recombinat (Shingrix) 02/09/2019, 06/17/2019  Covid vaccine discusses - declined.   No results found. Appt yearly with optho - August.   Dental:Yes and Within Last 6 months son dentist.   Alcohol: none  Tobacco: none  Exercise: runner. Walking daily. 6 miles per week or more running. No weights now, low weight lifting planned.    History Patient Active Problem List   Diagnosis Date Noted   DDD (degenerative disc disease), lumbar 08/18/2017   Situational anxiety 08/18/2017   Ankle pain 12/11/2011   Past Medical History:  Diagnosis Date   Allergy    seasonal   Anxiety    occasional   GERD (gastroesophageal reflux disease)    occasional   Past Surgical  History:  Procedure Laterality Date   ABDOMINAL HYSTERECTOMY     No Known Allergies Prior to Admission medications   Medication Sig Start Date End Date Taking? Authorizing Provider  ALPRAZolam (XANAX) 0.25 MG tablet Take 0.125 mg by mouth daily as needed for anxiety.    Yes [provider]  estradiol (VIVELLE-DOT) 0.1 MG/24HR patch Vivelle-Dot 0.1 mg/24 hr transdermal patch   Yes [provider]  famotidine (PEPCID) 10 MG tablet Take 10 mg by mouth 2 (two) times daily.   Yes [provider]  ibuprofen (ADVIL,MOTRIN) 200 MG tablet Take 200 mg by mouth every 6 (six) hours as needed.    Yes [provider]  Multiple Vitamins-Minerals (CENTRUM SILVER PO) Take 1 tablet by mouth daily.   Yes [provider]  albuterol (VENTOLIN HFA) 108 (90 Base) MCG/ACT inhaler Inhale 2 puffs into the lungs every 6 (six) hours as needed for wheezing or shortness of breath. 04/20/21   Maximiano Coss, NP  amoxicillin-clavulanate (AUGMENTIN) 875-125 MG tablet Take 1 tablet by mouth 2 (two) times daily. 04/20/21   Maximiano Coss, NP  benzonatate (TESSALON) 200 MG capsule Take 1 capsule (200 mg total) by mouth 2 (two) times daily as needed for cough. 04/20/21   Maximiano Coss, NP  fexofenadine [facility] ALLERGY) 180 MG tablet Take 1 tablet (180 mg total) by mouth daily for 15 days. 09/06/20 11/08/20  Eliezer Lofts, FNP  predniSONE (STERAPRED UNI-PAK 21 TAB) 10 MG (21) TBPK tablet Take per package instructions. Do not skip doses. Finish entire supply. 04/20/21   Maximiano Coss, NP  trimethoprim-polymyxin b (POLYTRIM) ophthalmic solution Place 1 drop into the right eye every 6 (six) hours. 06/28/21   [name], NP   Social History   Socioeconomic History   Marital status: Married    Spouse name: Not on file   Number of children: Not on file   Years of education: Not on file   Highest education level: Not on file  Occupational History   Not on file  Tobacco Use   Smoking  status: Never   Smokeless tobacco: Never  Substance and Sexual Activity   Alcohol use: Never   Drug use: Never   Sexual activity: Not Currently  Other Topics Concern   Not on file  Social History Narrative   Not on file   Social Determinants of Health   Financial Resource Strain: Not on file  Food Insecurity: Not on file  Transportation Needs: Not on file  Physical Activity: Not on file  Stress: Not on file  Social Connections: Not on file  Intimate Partner Violence: Not  on file    Review of Systems   Objective:   Vitals:   08/30/21 0859  BP: 118/60  Pulse: 68  Resp: 16  Temp: 97.9 F (36.6 C)  TempSrc: Temporal  SpO2: 97%  Weight: 134 lb (60.8 kg)  Height: 5' 3"  (1.6 m)     Physical Exam Constitutional:      Appearance: She is well-developed.  HENT:     Head: Normocephalic and atraumatic.     Right Ear: External ear normal.     Left Ear: External ear normal.  Eyes:     Conjunctiva/sclera: Conjunctivae normal.     Pupils: Pupils are equal, round, and reactive to light.  Neck:     Thyroid: No thyromegaly.     Comments: No thyroid nodules appreciated. Cardiovascular:     Rate and Rhythm: Normal rate and regular rhythm.     Heart sounds: Normal heart sounds. No murmur heard. Pulmonary:     Effort: Pulmonary effort is normal. No respiratory distress.     Breath sounds: Normal breath sounds. No wheezing.  Abdominal:     General: Bowel sounds are normal.     Palpations: Abdomen is soft.     Tenderness: There is no abdominal tenderness.  Musculoskeletal:        General: No tenderness. Normal range of motion.     Cervical back: Normal range of motion and neck supple.     Comments: Spine nontender, negative seated straight leg raise.  Ambulating without difficulty.  Lymphadenopathy:     Cervical: No cervical adenopathy.  Skin:    General: Skin is warm and dry.     Findings: No rash.  Neurological:     Mental Status: She is alert and oriented to person,  place, and time.  Psychiatric:        Behavior: Behavior normal.        Thought Content: Thought content normal.       Assessment & Plan:  Karen Pineda is a 62 y.o. female . Annual physical exam  - -anticipatory guidance as below in AVS, screening labs above. Health maintenance items as above in HPI discussed/recommended as applicable.   Family history of thyroid disease Screening for thyroid disorder Palpitations - Plan: CBC, TSH  -Overall stable intermittent palpitations, previous cardiology evaluation reassuring.  Check CBC, TSH with RTC precautions if increased frequency  DDD (degenerative disc disease), lumbar - Plan: Ambulatory referral to Spine Surgery  -Minimal symptoms and able to run as above without difficulty.  Based on degree of degenerative changes recommended against heavy weight lifting but will also refer to spine specialist to review imaging, and discuss any potential activity modification.  Low intensity exercise likely is helping with mobility and core strength.  Continue same for now.  Elevated hemoglobin A1c - Plan: Comprehensive metabolic panel, Hemoglobin A1c  -Updated labs ordered. Screening for hyperlipidemia - Plan: Lipid panel   No orders of the defined types were placed in this encounter.  Patient Instructions  Thanks for coming in today.  I will check some labs.  If any more frequent palpitations please follow-up to discuss further.  I do not think there would be any specific restrictions with exercise other than to avoid heavy weight lifting, but I will refer you to a back specialist to discuss further.  Let me know if you do not hear from them in the next few weeks. I do recommend calcium at least 1200 to 1500 mg/day, vitamin D 1000 units /day.  Take care!  Preventive Care 68-64 Years Old, Female Preventive care refers to lifestyle choices and visits with your health care provider that can promote health and wellness. Preventive care visits  are also called wellness exams. What can I expect for my preventive care visit? Counseling Your health care provider may ask you questions about your: Medical history, including: Past medical problems. Family medical history. Pregnancy history. Current health, including: Menstrual cycle. Method of birth control. Emotional well-being. Home life and relationship well-being. Sexual activity and sexual health. Lifestyle, including: Alcohol, nicotine or tobacco, and drug use. Access to firearms. Diet, exercise, and sleep habits. Work and work Statistician. Sunscreen use. Safety issues such as seatbelt and bike helmet use. Physical exam Your health care provider will check your: Height and weight. These may be used to calculate your BMI (body mass index). BMI is a measurement that tells if you are at a healthy weight. Waist circumference. This measures the distance around your waistline. This measurement also tells if you are at a healthy weight and may help predict your risk of certain diseases, such as type 2 diabetes and high blood pressure. Heart rate and blood pressure. Body temperature. Skin for abnormal spots. What immunizations do I need?  Vaccines are usually given at various ages, according to a schedule. Your health care provider will recommend vaccines for you based on your age, medical history, and lifestyle or other factors, such as travel or where you work. What tests do I need? Screening Your health care provider may recommend screening tests for certain conditions. This may include: Lipid and cholesterol levels. Diabetes screening. This is done by checking your blood sugar (glucose) after you have not eaten for a while (fasting). Pelvic exam and Pap test. Hepatitis B test. Hepatitis C test. HIV (human immunodeficiency virus) test. STI (sexually transmitted infection) testing, if you are at risk. Lung cancer screening. Colorectal cancer screening. Mammogram. Talk  with your health care provider about when you should start having regular mammograms. This may depend on whether you have a family history of breast cancer. BRCA-related cancer screening. This may be done if you have a family history of breast, ovarian, tubal, or peritoneal cancers. Bone density scan. This is done to screen for osteoporosis. Talk with your health care provider about your test results, treatment options, and if necessary, the need for more tests. Follow these instructions at home: Eating and drinking  Eat a diet that includes fresh fruits and vegetables, whole grains, lean protein, and low-fat dairy products. Take vitamin and mineral supplements as recommended by your health care provider. Do not drink alcohol if: Your health care provider tells you not to drink. You are pregnant, may be pregnant, or are planning to become pregnant. If you drink alcohol: Limit how much you have to 0-1 drink a day. Know how much alcohol is in your drink. In the [place] one drink equals one 12 oz bottle of beer (355 mL), one 5 oz glass of wine (148 mL), or one 1 oz glass of hard liquor (44 mL). Lifestyle Brush your teeth every morning and night with fluoride toothpaste. Floss one time each day. Exercise for at least 30 minutes 5 or more days each week. Do not use any products that contain nicotine or tobacco. These products include cigarettes, chewing tobacco, and vaping devices, such as e-cigarettes. If you need help quitting, ask your health care provider. Do not use drugs. If you are sexually active, practice safe sex. Use a condom or other  form of protection to prevent STIs. If you do not wish to become pregnant, use a form of birth control. If you plan to become pregnant, see your health care provider for a prepregnancy visit. Take aspirin only as told by your health care provider. Make sure that you understand how much to take and what form to take. Work with your health care provider to find  out whether it is safe and beneficial for you to take aspirin daily. Find healthy ways to manage stress, such as: Meditation, yoga, or listening to music. Journaling. Talking to a trusted person. Spending time with friends and family. Minimize exposure to UV radiation to reduce your risk of skin cancer. Safety Always wear your seat belt while driving or riding in a vehicle. Do not drive: If you have been drinking alcohol. Do not ride with someone who has been drinking. When you are tired or distracted. While texting. If you have been using any mind-altering substances or drugs. Wear a helmet and other protective equipment during sports activities. If you have firearms in your house, make sure you follow all gun safety procedures. Seek help if you have been physically or sexually abused. What's next? Visit your health care provider once a year for an annual wellness visit. Ask your health care provider how often you should have your eyes and teeth checked. Stay up to date on all vaccines. This information is not intended to replace advice given to you by your health care provider. Make sure you discuss any questions you have with your health care provider. Document Revised: 09/20/2020 Document Reviewed: 09/20/2020 Elsevier Patient Education  Joice,   Merri Ray, MD [facility] 08/30/21 10:02 AM

## 2021-09-21 DIAGNOSIS — M5451 Vertebrogenic low back pain: Secondary | ICD-10-CM | POA: Diagnosis not present

## 2021-10-02 DIAGNOSIS — M47816 Spondylosis without myelopathy or radiculopathy, lumbar region: Secondary | ICD-10-CM | POA: Diagnosis not present

## 2021-10-16 ENCOUNTER — Telehealth: Payer: BC Managed Care – PPO | Admitting: Family Medicine

## 2021-10-16 DIAGNOSIS — U071 COVID-19: Secondary | ICD-10-CM

## 2021-10-16 DIAGNOSIS — J019 Acute sinusitis, unspecified: Secondary | ICD-10-CM | POA: Diagnosis not present

## 2021-10-16 DIAGNOSIS — B9689 Other specified bacterial agents as the cause of diseases classified elsewhere: Secondary | ICD-10-CM

## 2021-10-16 MED ORDER — AMOXICILLIN-POT CLAVULANATE 875-125 MG PO TABS: 1.0000 | ORAL_TABLET | Freq: Two times a day (BID) | ORAL | 0 refills | Status: AC

## 2021-10-16 NOTE — Progress Notes (Signed)
Virtual Visit Consent   Karen Pineda, you are scheduled for a virtual visit with a [facility] provider today. Just as with appointments in the office, your consent must be obtained to participate. Your consent will be active for this visit and any virtual visit you may have with one of our providers in the next 365 days. If you have a MyChart account, a copy of this consent can be sent to you electronically.  As this is a virtual visit, video technology does not allow for your provider to perform a traditional examination. This may limit your provider's ability to fully assess your condition. If your provider identifies any concerns that need to be evaluated in person or the need to arrange testing (such as labs, EKG, etc.), we will make arrangements to do so. Although advances in technology are sophisticated, we cannot ensure that it will always work on either your end or our end. If the connection with a video visit is poor, the visit may have to be switched to a telephone visit. With either a video or telephone visit, we are not always able to ensure that we have a secure connection.  By engaging in this virtual visit, you consent to the provision of healthcare and authorize for your insurance to be billed (if applicable) for the services provided during this visit. Depending on your insurance coverage, you may receive a charge related to this service.  I need to obtain your verbal consent now. Are you willing to proceed with your visit today? Icess Bertoni has provided verbal consent on 10/16/2021 for a virtual visit (video or telephone). [name], NP  Date: 10/16/2021 8:48 AM  Virtual Visit via Video Note   I, [name], connected with  Laryssa Hassing  ([id number], [birth year]) on 10/16/21 at  8:45 AM EDT by a video-enabled telemedicine application and verified that I am speaking with the correct person using two identifiers.  Location: Patient: Virtual Visit Location  Patient: Home Provider: Virtual Visit Location Provider: Home Office   I discussed the limitations of evaluation and management by telemedicine and the availability of in person appointments. The patient expressed understanding and agreed to proceed.    History of Present Illness: Karen Pineda is a 61 y.o. who identifies as a female who was assigned female at birth, and is being seen today for COVID + on Friday. Onset was Last Wednesday 10/10/2021 with muscle aches and flu like symptoms. Headache, congestion, and ear pressure, some temporal, cheek tenderness and pain. Nasal drainage is clear. Has chills, unsure of fever has not checked. Denies chest pain or shortness of breath, cough. Has reported using rescue inhaler for more of a labored breathing- but nothing uncontrolled. Has treated zyrtec, mucinex and sudafed  Problems:  Patient Active Problem List   Diagnosis Date Noted   DDD (degenerative disc disease), lumbar 08/18/2017   Situational anxiety 08/18/2017   Ankle pain 12/11/2011    Allergies: No Known Allergies Medications:  Current Outpatient Medications:    ALPRAZolam (XANAX) 0.25 MG tablet, Take 0.125 mg by mouth daily as needed for anxiety. , Disp: , Rfl:    estradiol (VIVELLE-DOT) 0.1 MG/24HR patch, Vivelle-Dot 0.1 mg/24 hr transdermal patch, Disp: , Rfl:    famotidine (PEPCID) 10 MG tablet, Take 10 mg by mouth 2 (two) times daily., Disp: , Rfl:    fexofenadine (ALLEGRA ALLERGY) 180 MG tablet, Take 1 tablet (180 mg total) by mouth daily for 15 days., Disp: 15 tablet,  Rfl: 0   ibuprofen (ADVIL,MOTRIN) 200 MG tablet, Take 200 mg by mouth every 6 (six) hours as needed. , Disp: , Rfl:    Multiple Vitamins-Minerals (CENTRUM SILVER PO), Take 1 tablet by mouth daily., Disp: , Rfl:   Observations/Objective: Patient is well-developed, well-nourished in no acute distress.  Resting comfortably  at home.  Head is normocephalic, atraumatic.  No labored breathing.  Speech is clear and  coherent with logical content.  Patient is alert and oriented at baseline.  Nasal tone noted   Assessment and Plan: 1. Acute bacterial sinusitis  - amoxicillin-clavulanate (AUGMENTIN) 875-125 MG tablet; Take 1 tablet by mouth 2 (two) times daily for 7 days.  Dispense: 14 tablet; Refill: 0  2. COVID-19  -+ covid HT last Friday, symptoms duration going on 7 days -overall well controlled symptoms, but increased in sinus pressure and ear pain will treat for sinus infection -strict in person precautions reviewed -info on avs    Reviewed side effects, risks and benefits of medication.    Patient acknowledged agreement and understanding of the plan.   Past Medical, Surgical, Social History, Allergies, and Medications have been Reviewed.     Follow Up Instructions: I discussed the assessment and treatment plan with the patient. The patient was provided an opportunity to ask questions and all were answered. The patient agreed with the plan and demonstrated an understanding of the instructions.  A copy of instructions were sent to the patient via MyChart unless otherwise noted below.    The patient was advised to call back or seek an in-person evaluation if the symptoms worsen or if the condition fails to improve as anticipated.  Time:  I spent 15 minutes with the patient via telehealth technology discussing the above problems/concerns.    [name], NP

## 2021-10-16 NOTE — Patient Instructions (Signed)
Karen Pineda, thank you for joining [name], NP for today's virtual visit.  While this provider is not your primary care provider (PCP), if your PCP is located in our provider database this encounter information will be shared with them immediately following your visit.  Consent: (Patient) Karen Pineda provided verbal consent for this virtual visit at the beginning of the encounter.  Current Medications:  Current Outpatient Medications:    amoxicillin-clavulanate (AUGMENTIN) 875-125 MG tablet, Take 1 tablet by mouth 2 (two) times daily for 7 days., Disp: 14 tablet, Rfl: 0   ALPRAZolam (XANAX) 0.25 MG tablet, Take 0.125 mg by mouth daily as needed for anxiety. , Disp: , Rfl:    estradiol (VIVELLE-DOT) 0.1 MG/24HR patch, Vivelle-Dot 0.1 mg/24 hr transdermal patch, Disp: , Rfl:    famotidine (PEPCID) 10 MG tablet, Take 10 mg by mouth 2 (two) times daily., Disp: , Rfl:    fexofenadine (ALLEGRA ALLERGY) 180 MG tablet, Take 1 tablet (180 mg total) by mouth daily for 15 days., Disp: 15 tablet, Rfl: 0   ibuprofen (ADVIL,MOTRIN) 200 MG tablet, Take 200 mg by mouth every 6 (six) hours as needed. , Disp: , Rfl:    Multiple Vitamins-Minerals (CENTRUM SILVER PO), Take 1 tablet by mouth daily., Disp: , Rfl:    Medications ordered in this encounter:  Meds ordered this encounter  Medications   amoxicillin-clavulanate (AUGMENTIN) 875-125 MG tablet    Sig: Take 1 tablet by mouth 2 (two) times daily for 7 days.    Dispense:  14 tablet    Refill:  0    Order Specific Question:   Supervising Provider    Answer:   Sabra Heck, BRIAN [[id number]]     *If you need refills on other medications prior to your next appointment, please contact your pharmacy*  Follow-Up: Call back or seek an in-person evaluation if the symptoms worsen or if the condition fails to improve as anticipated.  Other Instructions  Please keep well-hydrated and get plenty of rest. Start a saline nasal rinse to flush out  your nasal passages. You can use plain Mucinex to help thin congestion. If you have a humidifier, running in the bedroom at night. I want you to start OTC vitamin D3 1000 units daily, vitamin C 1000 mg daily, and a zinc supplement. Please take prescribed medications as directed.  You have been enrolled in a MyChart symptom monitoring program. Please answer these questions daily so we can keep track of how you are doing.  You were to quarantine for 5 days from onset of your symptoms.  After day 5, if you have had no fever and you are feeling better, you can end quarantine but need to mask for an additional 5 days. After day 5 if you have a fever or are having significant symptoms, please quarantine for full 10 days.  If you note any worsening of symptoms, any significant shortness of breath or any chest pain, please seek ER evaluation ASAP.  Please do not delay care!  COVID-19: What to Do if You Are Sick If you test positive and are an older adult or someone who is at high risk of getting very sick from COVID-19, treatment may be available. Contact a healthcare provider right away after a positive test to determine if you are eligible, even if your symptoms are mild right now. You can also visit a Test to Treat location and, if eligible, receive a prescription from a provider. Don't delay: Treatment must be started  within the first few days to be effective. If you have a fever, cough, or other symptoms, you might have COVID-19. Most people have mild illness and are able to recover at home. If you are sick: Keep track of your symptoms. If you have an emergency warning sign (including trouble breathing), call 911. Steps to help prevent the spread of COVID-19 if you are sick If you are sick with COVID-19 or think you might have COVID-19, follow the steps below to care for yourself and to help protect other people in your home and community. Stay home except to get medical care Stay home. Most  people with COVID-19 have mild illness and can recover at home without medical care. Do not leave your home, except to get medical care. Do not visit public areas and do not go to places where you are unable to wear a mask. Take care of yourself. Get rest and stay hydrated. Take over-the-counter medicines, such as acetaminophen, to help you feel better. Stay in touch with your doctor. Call before you get medical care. Be sure to get care if you have trouble breathing, or have any other emergency warning signs, or if you think it is an emergency. Avoid public transportation, ride-sharing, or taxis if possible. Get tested If you have symptoms of COVID-19, get tested. While waiting for test results, stay away from others, including staying apart from those living in your household. Get tested as soon as possible after your symptoms start. Treatments may be available for people with COVID-19 who are at risk for becoming very sick. Don't delay: Treatment must be started early to be effective--some treatments must begin within 5 days of your first symptoms. Contact your healthcare provider right away if your test result is positive to determine if you are eligible. Self-tests are one of several options for testing for the virus that causes COVID-19 and may be more convenient than laboratory-based tests and point-of-care tests. Ask your healthcare provider or your local health department if you need help interpreting your test results. You can visit your state, tribal, local, and territorial health department's website to look for the latest local information on testing sites. Separate yourself from other people As much as possible, stay in a specific room and away from other people and pets in your home. If possible, you should use a separate bathroom. If you need to be around other people or animals in or outside of the home, wear a well-fitting mask. Tell your close contacts that they may have been exposed to  COVID-19. An infected person can spread COVID-19 starting 48 hours (or 2 days) before the person has any symptoms or tests positive. By letting your close contacts know they may have been exposed to COVID-19, you are helping to protect everyone. See COVID-19 and Animals if you have questions about pets. If you are diagnosed with COVID-19, someone from the health department may call you. Answer the call to slow the spread. Monitor your symptoms Symptoms of COVID-19 include fever, cough, or other symptoms. Follow care instructions from your healthcare provider and local health department. Your local health authorities may give instructions on checking your symptoms and reporting information. When to seek emergency medical attention Look for emergency warning signs* for COVID-19. If someone is showing any of these signs, seek emergency medical care immediately: Trouble breathing Persistent pain or pressure in the chest New confusion Inability to wake or stay awake Pale, gray, or blue-colored skin, lips, or nail beds, depending on skin  tone *This list is not all possible symptoms. Please call your medical provider for any other symptoms that are severe or concerning to you. Call 911 or call ahead to your local emergency facility: Notify the operator that you are seeking care for someone who has or may have COVID-19. Call ahead before visiting your doctor Call ahead. Many medical visits for routine care are being postponed or done by phone or telemedicine. If you have a medical appointment that cannot be postponed, call your doctor's office, and tell them you have or may have COVID-19. This will help the office protect themselves and other patients. If you are sick, wear a well-fitting mask You should wear a mask if you must be around other people or animals, including pets (even at home). Wear a mask with the best fit, protection, and comfort for you. You don't need to wear the mask if you are  alone. If you can't put on a mask (because of trouble breathing, for example), cover your coughs and sneezes in some other way. Try to stay at least 6 feet away from other people. This will help protect the people around you. Masks should not be placed on young children under age 10 years, anyone who has trouble breathing, or anyone who is not able to remove the mask without help. Cover your coughs and sneezes Cover your mouth and nose with a tissue when you cough or sneeze. Throw away used tissues in a lined trash can. Immediately wash your hands with soap and water for at least 20 seconds. If soap and water are not available, clean your hands with an alcohol-based hand sanitizer that contains at least 60% alcohol. Clean your hands often Wash your hands often with soap and water for at least 20 seconds. This is especially important after blowing your nose, coughing, or sneezing; going to the bathroom; and before eating or preparing food. Use hand sanitizer if soap and water are not available. Use an alcohol-based hand sanitizer with at least 60% alcohol, covering all surfaces of your hands and rubbing them together until they feel dry. Soap and water are the best option, especially if hands are visibly dirty. Avoid touching your eyes, nose, and mouth with unwashed hands. Handwashing Tips Avoid sharing personal household items Do not share dishes, drinking glasses, cups, eating utensils, towels, or bedding with other people in your home. Wash these items thoroughly after using them with soap and water or put in the dishwasher. Clean surfaces in your home regularly Clean and disinfect high-touch surfaces (for example, doorknobs, tables, handles, light switches, and countertops) in your "sick room" and bathroom. In shared spaces, you should clean and disinfect surfaces and items after each use by the person who is ill. If you are sick and cannot clean, a caregiver or other person should only clean and  disinfect the area around you (such as your bedroom and bathroom) on an as needed basis. Your caregiver/other person should wait as long as possible (at least several hours) and wear a mask before entering, cleaning, and disinfecting shared spaces that you use. Clean and disinfect areas that may have blood, stool, or body fluids on them. Use household cleaners and disinfectants. Clean visible dirty surfaces with household cleaners containing soap or detergent. Then, use a household disinfectant. Use a product from [employer] List N: Disinfectants for Coronavirus (IHKVQ-25). Be sure to follow the instructions on the label to ensure safe and effective use of the product. Many products recommend keeping the surface  wet with a disinfectant for a certain period of time (look at "contact time" on the product label). You may also need to wear personal protective equipment, such as gloves, depending on the directions on the product label. Immediately after disinfecting, wash your hands with soap and water for 20 seconds. For completed guidance on cleaning and disinfecting your home, visit Complete Disinfection Guidance. Take steps to improve ventilation at home Improve ventilation (air flow) at home to help prevent from spreading COVID-19 to other people in your household. Clear out COVID-19 virus particles in the air by opening windows, using air filters, and turning on fans in your home. Use this interactive tool to learn how to improve air flow in your home. When you can be around others after being sick with COVID-19 Deciding when you can be around others is different for different situations. Find out when you can safely end home isolation. For any additional questions about your care, contact your healthcare provider or state or local health department. 06/27/2020 Content source: [facility] ([facility]), Division of Viral Diseases This information is not intended  to replace advice given to you by your health care provider. Make sure you discuss any questions you have with your health care provider. Document Revised: 08/10/2020 Document Reviewed: 08/10/2020 Elsevier Patient Education  2022 [employer].      If you have been instructed to have an in-person evaluation today at a local Urgent Care facility, please use the link below. It will take you to a list of all of our available [facility], including address, phone number and hours of operation. Please do not delay care.  [facility]  If you or a family member do not have a primary care provider, use the link below to schedule a visit and establish care. When you choose a [facility] primary care physician or advanced practice provider, you gain a long-term partner in health. Find a Primary Care Provider  Learn more about 's in-office and virtual care options: [facility]

## 2021-10-25 DIAGNOSIS — Z124 Encounter for screening for malignant neoplasm of cervix: Secondary | ICD-10-CM | POA: Diagnosis not present

## 2021-10-25 DIAGNOSIS — Z1151 Encounter for screening for human papillomavirus (HPV): Secondary | ICD-10-CM | POA: Diagnosis not present

## 2021-10-25 DIAGNOSIS — Z6824 Body mass index (BMI) 24.0-24.9, adult: Secondary | ICD-10-CM | POA: Diagnosis not present

## 2021-10-25 DIAGNOSIS — Z01419 Encounter for gynecological examination (general) (routine) without abnormal findings: Secondary | ICD-10-CM | POA: Diagnosis not present

## 2021-11-20 DIAGNOSIS — H524 Presbyopia: Secondary | ICD-10-CM | POA: Diagnosis not present

## 2021-11-20 DIAGNOSIS — H2513 Age-related nuclear cataract, bilateral: Secondary | ICD-10-CM | POA: Diagnosis not present

## 2021-11-20 DIAGNOSIS — H04123 Dry eye syndrome of bilateral lacrimal glands: Secondary | ICD-10-CM | POA: Diagnosis not present

## 2021-11-30 ENCOUNTER — Other Ambulatory Visit: Payer: Self-pay | Admitting: Family Medicine

## 2021-11-30 DIAGNOSIS — Z1231 Encounter for screening mammogram for malignant neoplasm of breast: Secondary | ICD-10-CM

## 2022-01-24 ENCOUNTER — Ambulatory Visit
Admission: RE | Admit: 2022-01-24 | Discharge: 2022-01-24 | Disposition: A | Discharge status: Home / Self Care | Payer: BC Managed Care – PPO | Source: Ambulatory Visit | Attending: Family Medicine | Admitting: Family Medicine

## 2022-01-24 DIAGNOSIS — Z1231 Encounter for screening mammogram for malignant neoplasm of breast: Secondary | ICD-10-CM

## 2022-02-08 DIAGNOSIS — M1812 Unilateral primary osteoarthritis of first carpometacarpal joint, left hand: Secondary | ICD-10-CM | POA: Diagnosis not present

## 2022-05-20 ENCOUNTER — Ambulatory Visit: Payer: BC Managed Care – PPO | Admitting: Family Medicine

## 2022-05-20 ENCOUNTER — Ambulatory Visit: Payer: Self-pay

## 2022-05-20 VITALS — BP 120/82 | Ht 63.0 in | Wt 140.0 lb

## 2022-05-20 DIAGNOSIS — I788 Other diseases of capillaries: Secondary | ICD-10-CM | POA: Diagnosis not present

## 2022-05-20 DIAGNOSIS — L570 Actinic keratosis: Secondary | ICD-10-CM | POA: Diagnosis not present

## 2022-05-20 DIAGNOSIS — M79651 Pain in right thigh: Secondary | ICD-10-CM

## 2022-05-20 NOTE — Patient Instructions (Signed)
You have a Grade 2 quad strain. Do home exercises as directed. Ice or heat 15 minutes at a time 3-4 times a day. Tylenol or ibuprofen only if needed. Compression with a sleeve or ACE wrap when up and walking around. Ok to cycle, swim for exercise. Avoid any jumping, cutting activities for now. Wait about 2 weeks before walking for exercise, likely closer to 4-6 weeks to get back to jogging. Follow up with [place] in about 4 weeks.

## 2022-05-20 NOTE — Progress Notes (Signed)
  Karen Pineda - 62 y.o. female MRN [id number]  Date of birth: [birth year]  SUBJECTIVE:  Including CC & ROS.  CC: Right thigh injury  HPI: Patient is presenting with anterior right thigh pain since Friday when she performed a broad jump on concrete with her grandson. Felt immediate pain and heard a snap upon landing on her R leg. Noticed some slight swelling around the knee at the time but did not land on her knee. Over the weekend she had difficulty walking with her R leg and going up stairs. She was taking ibuprofen '800mg'$  TID over the weekend and has employed other RICE techniques. Denies bruising, numbness, or tingling. She feels significantly better today compared to Friday.    HISTORY: Past Medical, Surgical, Social, and Family History Reviewed & Updated per EMR.   Pertinent Historical Findings include: none  PHYSICAL EXAM:  VS: BP:120/82  HR: bpm  TEMP: ( )  RESP:   HT:'5\' 3"'$  (160 cm)   WT:140 lb (63.5 kg)  BMI:24.81 PHYSICAL EXAM: Knee: - Inspection: no gross deformity. No swelling/effusion, erythema or bruising. Skin intact - Palpation: Mild TTP along inferior portion of anterior thigh. No TTP at knee joint.  - ROM: full active ROM with flexion and extension in knee and hip - Strength: 4/5 knee extension, 5/5 knee flexion. 5/5 hip flexion, 5/5 hip extension. 5/5 hip abduction and adduction.  - Neuro/vasc: NV intact - Special Tests: - LIGAMENTS: negative anterior and posterior drawer, negative Lachman's, no MCL or LCL laxity   Limited US MSK R leg: Quad tendon visualized and intact. No effusion. Normal muscular architecture of rectus femoris. Mild loss of muscular architecture of distal vastus lateralis suggestive of partial muscle tear.   ASSESSMENT & PLAN: See problem based charting & AVS for pt instructions.  R Thigh pain  Grade II Quadriceps strain - Patient has partial muscular tearing of vastus lateralis. No concern for complete detachment. Provided home exercises for  quadriceps strengthening. Continue RICE therapy. Avoid any prolonged weight bearing or movements such as running/jumping for 4-6 weeks, can do lower impact exercise such as cycling or swimming. Follow up in 4 weeks.   Estevan Oaks, [facility]

## 2022-06-03 ENCOUNTER — Ambulatory Visit: Payer: BC Managed Care – PPO | Admitting: Family Medicine

## 2022-06-03 VITALS — BP 110/70 | Ht 63.0 in | Wt 140.0 lb

## 2022-06-03 DIAGNOSIS — M79651 Pain in right thigh: Secondary | ICD-10-CM | POA: Diagnosis not present

## 2022-06-03 NOTE — Progress Notes (Signed)
PCP: Wendie Agreste, MD  Subjective:   HPI: Patient is a 62 y.o. female here for right leg injury.  2/12: Patient is presenting with anterior right thigh pain since Friday when she performed a broad jump on concrete with her grandson. Felt immediate pain and heard a snap upon landing on her R leg. Noticed some slight swelling around the knee at the time but did not land on her knee. Over the weekend she had difficulty walking with her R leg and going up stairs. She was taking ibuprofen '800mg'$  TID over the weekend and has employed other RICE techniques. Denies bruising, numbness, or tingling. She feels significantly better today compared to Friday.   2/26: Patient reports she is improving from her quad strain. She is leaving soon on a trip to [place] and wanted to check in before that trip. She reports last week she had more pain but this has improved. Also a week ago bent down and felt a rip in anterior quad that relieved pressure/tightness in her quad area. Has been working out including walking, recumbent bike, in pool. Doing home exercises we provided last visit.  Past Medical History:  Diagnosis Date   Allergy    seasonal   Anxiety    occasional   GERD (gastroesophageal reflux disease)    occasional    Current Outpatient Medications on File Prior to Visit  Medication Sig Dispense Refill   ALPRAZolam (XANAX) 0.25 MG tablet Take 0.125 mg by mouth daily as needed for anxiety.      estradiol (VIVELLE-DOT) 0.1 MG/24HR patch Vivelle-Dot 0.1 mg/24 hr transdermal patch     famotidine (PEPCID) 10 MG tablet Take 10 mg by mouth 2 (two) times daily.     fexofenadine (ALLEGRA ALLERGY) 180 MG tablet Take 1 tablet (180 mg total) by mouth daily for 15 days. 15 tablet 0   ibuprofen (ADVIL,MOTRIN) 200 MG tablet Take 200 mg by mouth every 6 (six) hours as needed.      Multiple Vitamins-Minerals (CENTRUM SILVER PO) Take 1 tablet by mouth daily.     No current facility-administered medications  on file prior to visit.    Past Surgical History:  Procedure Laterality Date   ABDOMINAL HYSTERECTOMY      No Known Allergies  BP 110/70   Ht '5\' 3"'$  (1.6 m)   Wt 140 lb (63.5 kg)   BMI 24.80 kg/m       No data to display              No data to display              Objective:  Physical Exam:  Gen: NAD, comfortable in exam room  Right leg/knee: No deformity, swelling, bruising. FROM with 5/5 strength knee flexion and extension. Minimal tenderness to palpation over distal vastus lateralis.  No other tenderness. NVI distally.   Limited MSK u/s right thigh:  Small tear again visualized within vastus lateralis distally with small area of hematoma. Assessment & Plan:  1. Right quad strain - noted small tear in vastus lateralis.  Clinically healing.  Suspect the rip she had a week ago was some disruption of scar tissue.  Continue advancing rehab as she has been.  Thigh sleeve.  F/u in 4 weeks.  Discussed activity modification.

## 2022-06-03 NOTE — Patient Instructions (Signed)
Add a 2 or 3 pound ankle weight when the exercises get too easy. Continue the sleeve at least with exercise for 4 more weeks. Follow up with me in 4 weeks instead of 2. Have a safe and fun trip to [place]!

## 2022-06-17 ENCOUNTER — Ambulatory Visit: Payer: BC Managed Care – PPO | Admitting: Family Medicine

## 2022-07-01 ENCOUNTER — Ambulatory Visit: Payer: BC Managed Care – PPO | Admitting: Family Medicine

## 2022-07-01 ENCOUNTER — Encounter: Payer: Self-pay | Admitting: Family Medicine

## 2022-07-01 VITALS — BP 110/74 | Ht 63.5 in | Wt 140.0 lb

## 2022-07-01 DIAGNOSIS — M79651 Pain in right thigh: Secondary | ICD-10-CM

## 2022-07-01 NOTE — Progress Notes (Signed)
PCP: Wendie Agreste, MD  Subjective:   HPI: Patient is a 62 y.o. female here for right leg injury.  2/12: Patient is presenting with anterior right thigh pain since Friday when she performed a broad jump on concrete with her grandson. Felt immediate pain and heard a snap upon landing on her R leg. Noticed some slight swelling around the knee at the time but did not land on her knee. Over the weekend she had difficulty walking with her R leg and going up stairs. She was taking ibuprofen 800mg  TID over the weekend and has employed other RICE techniques. Denies bruising, numbness, or tingling. She feels significantly better today compared to Friday.   2/26: Patient reports she is improving from her quad strain. She is leaving soon on a trip to [place] and wanted to check in before that trip. She reports last week she had more pain but this has improved. Also a week ago bent down and felt a rip in anterior quad that relieved pressure/tightness in her quad area. Has been working out including walking, recumbent bike, in pool. Doing home exercises we provided last visit.  3/25: Patient reports she's doing better. Doing home exercises. Lateral quad feels a little tight. Has not returned to running yet. Not needing any medications for this.  Past Medical History:  Diagnosis Date   Allergy    seasonal   Anxiety    occasional   GERD (gastroesophageal reflux disease)    occasional    Current Outpatient Medications on File Prior to Visit  Medication Sig Dispense Refill   ALPRAZolam (XANAX) 0.25 MG tablet Take 0.125 mg by mouth daily as needed for anxiety.      estradiol (VIVELLE-DOT) 0.1 MG/24HR patch Vivelle-Dot 0.1 mg/24 hr transdermal patch     famotidine (PEPCID) 10 MG tablet Take 10 mg by mouth 2 (two) times daily.     fexofenadine (ALLEGRA ALLERGY) 180 MG tablet Take 1 tablet (180 mg total) by mouth daily for 15 days. 15 tablet 0   ibuprofen (ADVIL,MOTRIN) 200 MG tablet Take 200  mg by mouth every 6 (six) hours as needed.      Multiple Vitamins-Minerals (CENTRUM SILVER PO) Take 1 tablet by mouth daily.     No current facility-administered medications on file prior to visit.    Past Surgical History:  Procedure Laterality Date   ABDOMINAL HYSTERECTOMY      No Known Allergies  BP 110/74   Ht 5' 3.5" (1.613 m)   Wt 140 lb (63.5 kg)   BMI 24.41 kg/m       No data to display              No data to display              Objective:  Physical Exam:  Gen: NAD, comfortable in exam room  Right leg/knee: No gross deformity, ecchymoses, swelling. No TTP. FROM with normal strength. Negative ant/post drawers. Negative valgus/varus testing. Negative lachman. Negative mcmurrays, apleys. NV intact distally.  Assessment & Plan:  1. Right quad strain - clinically healed at this point from small vastus lateralis tear.  Home exercises 3-4 times a week.  Reviewed return to running protocol.  She asked about some pain she's been having in gluteal region - tenderness over hip external rotators - will provide some home exercises for these.  F/u prn.  Otc medications prn.

## 2022-07-01 NOTE — Patient Instructions (Signed)
You're doing great! Don't run on back to back days. Start walk:jog 1:1 minute for 10 total minutes.  If you do well as expected, in 2 days you could do 1:2 minutes walk: jog for 15 minutes, continue increasing as we discussed. Do home exercises 3-4 times a week for 4-6 more weeks. Add the hip exercises as well. Follow up with me as needed.

## 2022-09-05 ENCOUNTER — Encounter: Payer: BC Managed Care – PPO | Admitting: Family Medicine

## 2022-09-12 ENCOUNTER — Encounter: Payer: BC Managed Care – PPO | Admitting: Family Medicine

## 2022-10-29 DIAGNOSIS — Z6825 Body mass index (BMI) 25.0-25.9, adult: Secondary | ICD-10-CM | POA: Diagnosis not present

## 2022-10-29 DIAGNOSIS — Z01419 Encounter for gynecological examination (general) (routine) without abnormal findings: Secondary | ICD-10-CM | POA: Diagnosis not present

## 2022-10-30 DIAGNOSIS — L821 Other seborrheic keratosis: Secondary | ICD-10-CM | POA: Diagnosis not present

## 2022-10-30 DIAGNOSIS — K130 Diseases of lips: Secondary | ICD-10-CM | POA: Diagnosis not present

## 2022-10-30 DIAGNOSIS — L814 Other melanin hyperpigmentation: Secondary | ICD-10-CM | POA: Diagnosis not present

## 2022-10-30 DIAGNOSIS — D225 Melanocytic nevi of trunk: Secondary | ICD-10-CM | POA: Diagnosis not present

## 2022-10-30 DIAGNOSIS — L570 Actinic keratosis: Secondary | ICD-10-CM | POA: Diagnosis not present

## 2022-10-31 ENCOUNTER — Encounter: Payer: Self-pay | Admitting: Family Medicine

## 2022-10-31 ENCOUNTER — Ambulatory Visit (INDEPENDENT_AMBULATORY_CARE_PROVIDER_SITE_OTHER): Payer: BC Managed Care – PPO | Admitting: Family Medicine

## 2022-10-31 VITALS — BP 116/60 | HR 69 | Temp 97.6°F | Ht 62.5 in | Wt 137.0 lb

## 2022-10-31 DIAGNOSIS — F418 Other specified anxiety disorders: Secondary | ICD-10-CM

## 2022-10-31 DIAGNOSIS — E785 Hyperlipidemia, unspecified: Secondary | ICD-10-CM | POA: Diagnosis not present

## 2022-10-31 DIAGNOSIS — Z0000 Encounter for general adult medical examination without abnormal findings: Secondary | ICD-10-CM | POA: Diagnosis not present

## 2022-10-31 DIAGNOSIS — R7309 Other abnormal glucose: Secondary | ICD-10-CM | POA: Diagnosis not present

## 2022-10-31 LAB — COMPREHENSIVE METABOLIC PANEL
ALT: 14 U/L (ref 0–35)
AST: 20 U/L (ref 0–37)
Albumin: 4.7 g/dL (ref 3.5–5.2)
Alkaline Phosphatase: 67 U/L (ref 39–117)
BUN: 13 mg/dL (ref 6–23)
CO2: 30 mEq/L (ref 19–32)
Calcium: 10 mg/dL (ref 8.4–10.5)
Chloride: 100 mEq/L (ref 96–112)
Creatinine, Ser: 0.81 mg/dL (ref 0.40–1.20)
GFR: 76.82 mL/min (ref >60.00)
Glucose, Bld: 94 mg/dL (ref 70–99)
Potassium: 4 mEq/L (ref 3.5–5.1)
Sodium: 136 mEq/L (ref 135–145)
Total Bilirubin: 0.5 mg/dL (ref 0.2–1.2)
Total Protein: 7.7 g/dL (ref 6.0–8.3)

## 2022-10-31 LAB — LIPID PANEL
Cholesterol: 220 mg/dL — ABNORMAL HIGH (ref 0–200)
HDL: 88.5 mg/dL (ref >39.00)
LDL Cholesterol: 120 mg/dL — ABNORMAL HIGH (ref 0–99)
NonHDL: 131.62
Total CHOL/HDL Ratio: 2
Triglycerides: 60 mg/dL (ref 0.0–149.0)
VLDL: 12 mg/dL (ref 0.0–40.0)

## 2022-10-31 LAB — HEMOGLOBIN A1C: Hgb A1c MFr Bld: 5.8 % (ref 4.6–6.5)

## 2022-10-31 NOTE — Patient Instructions (Addendum)
Thank you for coming in today.  I do not have any specific concerns today, see information below on sleep and follow-up if that continues to be an issue.  Continue to try to incorporate exercise throughout the week, follow-up if any new symptoms.  If any concerns on labs I will let you know.  Take care!  Preventive Care 45-64 Years Old, Female Preventive care refers to lifestyle choices and visits with your health care provider that can promote health and wellness. Preventive care visits are also called wellness exams. What can I expect for my preventive care visit? Counseling Your health care provider may ask you questions about your: Medical history, including: Past medical problems. Family medical history. Pregnancy history. Current health, including: Menstrual cycle. Method of birth control. Emotional well-being. Home life and relationship well-being. Sexual activity and sexual health. Lifestyle, including: Alcohol, nicotine or tobacco, and drug use. Access to firearms. Diet, exercise, and sleep habits. Work and work Astronomer. Sunscreen use. Safety issues such as seatbelt and bike helmet use. Physical exam Your health care provider will check your: Height and weight. These may be used to calculate your BMI (body mass index). BMI is a measurement that tells if you are at a healthy weight. Waist circumference. This measures the distance around your waistline. This measurement also tells if you are at a healthy weight and may help predict your risk of certain diseases, such as type 2 diabetes and high blood pressure. Heart rate and blood pressure. Body temperature. Skin for abnormal spots. What immunizations do I need?  Vaccines are usually given at various ages, according to a schedule. Your health care provider will recommend vaccines for you based on your age, medical history, and lifestyle or other factors, such as travel or where you work. What tests do I  need? Screening Your health care provider may recommend screening tests for certain conditions. This may include: Lipid and cholesterol levels. Diabetes screening. This is done by checking your blood sugar (glucose) after you have not eaten for a while (fasting). Pelvic exam and Pap test. Hepatitis B test. Hepatitis C test. HIV (human immunodeficiency virus) test. STI (sexually transmitted infection) testing, if you are at risk. Lung cancer screening. Colorectal cancer screening. Mammogram. Talk with your health care provider about when you should start having regular mammograms. This may depend on whether you have a family history of breast cancer. BRCA-related cancer screening. This may be done if you have a family history of breast, ovarian, tubal, or peritoneal cancers. Bone density scan. This is done to screen for osteoporosis. Talk with your health care provider about your test results, treatment options, and if necessary, the need for more tests. Follow these instructions at home: Eating and drinking  Eat a diet that includes fresh fruits and vegetables, whole grains, lean protein, and low-fat dairy products. Take vitamin and mineral supplements as recommended by your health care provider. Do not drink alcohol if: Your health care provider tells you not to drink. You are pregnant, may be pregnant, or are planning to become pregnant. If you drink alcohol: Limit how much you have to 0-1 drink a day. Know how much alcohol is in your drink. In the [place] one drink equals one 12 oz bottle of beer (355 mL), one 5 oz glass of wine (148 mL), or one 1 oz glass of hard liquor (44 mL). Lifestyle Brush your teeth every morning and night with fluoride toothpaste. Floss one time each day. Exercise for at least 30 minutes  5 or more days each week. Do not use any products that contain nicotine or tobacco. These products include cigarettes, chewing tobacco, and vaping devices, such as  e-cigarettes. If you need help quitting, ask your health care provider. Do not use drugs. If you are sexually active, practice safe sex. Use a condom or other form of protection to prevent STIs. If you do not wish to become pregnant, use a form of birth control. If you plan to become pregnant, see your health care provider for a prepregnancy visit. Take aspirin only as told by your health care provider. Make sure that you understand how much to take and what form to take. Work with your health care provider to find out whether it is safe and beneficial for you to take aspirin daily. Find healthy ways to manage stress, such as: Meditation, yoga, or listening to music. Journaling. Talking to a trusted person. Spending time with friends and family. Minimize exposure to UV radiation to reduce your risk of skin cancer. Safety Always wear your seat belt while driving or riding in a vehicle. Do not drive: If you have been drinking alcohol. Do not ride with someone who has been drinking. When you are tired or distracted. While texting. If you have been using any mind-altering substances or drugs. Wear a helmet and other protective equipment during sports activities. If you have firearms in your house, make sure you follow all gun safety procedures. Seek help if you have been physically or sexually abused. What's next? Visit your health care provider once a year for an annual wellness visit. Ask your health care provider how often you should have your eyes and teeth checked. Stay up to date on all vaccines. This information is not intended to replace advice given to you by your health care provider. Make sure you discuss any questions you have with your health care provider. Document Revised: 09/20/2020 Document Reviewed: 09/20/2020 Elsevier Patient Education  2024 [employer]   See info on sleep below, but please follow up if persistent difficulty with sleep.  There is a recommendation for  RSV vaccine for patients over age 76.   Typically would recommend the RSV vaccine as most important for patients over age 18 that have comorbidities that put them at increased risk for severe disease (heart disease such as congestive heart failure, coronary artery disease, lung disease such as asthma or COPD, kidney disease, liver disease, diabetes, chronic or progressive neurologic or muscular conditions, immunosuppressed, or being frail or of advanced age).  For others that do not have these risk factors, there still is some benefit from vaccination since age is one of the main risk factors for developing severe disease however baseline risk of developing severe disease and requiring hospitalization is likely to be lower compared to those that have comorbidities in addition to age.   CDC does have some information as well: [employer]  Insomnia Insomnia is a sleep disorder that makes it difficult to fall asleep or stay asleep. Insomnia can cause fatigue, low energy, difficulty concentrating, mood swings, and poor performance at work or school. There are three different ways to classify insomnia: Difficulty falling asleep. Difficulty staying asleep. Waking up too early in the morning. Any type of insomnia can be long-term (chronic) or short-term (acute). Both are common. Short-term insomnia usually lasts for 3 months or less. Chronic insomnia occurs at least three times a week for longer than 3 months. What are the causes? Insomnia may be caused by another condition,  situation, or substance, such as: Having certain mental health conditions, such as anxiety and depression. Using caffeine, alcohol, tobacco, or drugs. Having gastrointestinal conditions, such as gastroesophageal reflux disease (GERD). Having certain medical conditions. These include: Asthma. Alzheimer's disease. Stroke. Chronic pain. An overactive thyroid gland  (hyperthyroidism). Other sleep disorders, such as restless legs syndrome and sleep apnea. Menopause. Sometimes, the cause of insomnia may not be known. What increases the risk? Risk factors for insomnia include: Gender. Females are affected more often than males. Age. Insomnia is more common as people get older. Stress and certain medical and mental health conditions. Lack of exercise. Having an irregular work schedule. This may include working night shifts and traveling between different time zones. What are the signs or symptoms? If you have insomnia, the main symptom is having trouble falling asleep or having trouble staying asleep. This may lead to other symptoms, such as: Feeling tired or having low energy. Feeling nervous about going to sleep. Not feeling rested in the morning. Having trouble concentrating. Feeling irritable, anxious, or depressed. How is this diagnosed? This condition may be diagnosed based on: Your symptoms and medical history. Your health care provider may ask about: Your sleep habits. Any medical conditions you have. Your mental health. A physical exam. How is this treated? Treatment for insomnia depends on the cause. Treatment may focus on treating an underlying condition that is causing the insomnia. Treatment may also include: Medicines to help you sleep. Counseling or therapy. Lifestyle adjustments to help you sleep better. Follow these instructions at home: Eating and drinking  Limit or avoid alcohol, caffeinated beverages, and products that contain nicotine and tobacco, especially close to bedtime. These can disrupt your sleep. Do not eat a large meal or eat spicy foods right before bedtime. This can lead to digestive discomfort that can make it hard for you to sleep. Sleep habits  Keep a sleep diary to help you and your health care provider figure out what could be causing your insomnia. Write down: When you sleep. When you wake up during the  night. How well you sleep and how rested you feel the next day. Any side effects of medicines you are taking. What you eat and drink. Make your bedroom a dark, comfortable place where it is easy to fall asleep. Put up shades or blackout curtains to block light from outside. Use a white noise machine to block noise. Keep the temperature cool. Limit screen use before bedtime. This includes: Not watching TV. Not using your smartphone, tablet, or computer. Stick to a routine that includes going to bed and waking up at the same times every day and night. This can help you fall asleep faster. Consider making a quiet activity, such as reading, part of your nighttime routine. Try to avoid taking naps during the day so that you sleep better at night. Get out of bed if you are still awake after 15 minutes of trying to sleep. Keep the lights down, but try reading or doing a quiet activity. When you feel sleepy, go back to bed. General instructions Take over-the-counter and prescription medicines only as told by your health care provider. Exercise regularly as told by your health care provider. However, avoid exercising in the hours right before bedtime. Use relaxation techniques to manage stress. Ask your health care provider to suggest some techniques that may work well for you. These may include: Breathing exercises. Routines to release muscle tension. Visualizing peaceful scenes. Make sure that you drive carefully. Do not  drive if you feel very sleepy. Keep all follow-up visits. This is important. Contact a health care provider if: You are tired throughout the day. You have trouble in your daily routine due to sleepiness. You continue to have sleep problems, or your sleep problems get worse. Get help right away if: You have thoughts about hurting yourself or someone else. Get help right away if you feel like you may hurt yourself or others, or have thoughts about taking your own life. Go to your  nearest emergency room or: Call 911. Call the National Suicide Prevention Lifeline at 970-859-4229 or 988. This is open 24 hours a day. Text the Crisis Text Line at 747-435-8133. Summary Insomnia is a sleep disorder that makes it difficult to fall asleep or stay asleep. Insomnia can be long-term (chronic) or short-term (acute). Treatment for insomnia depends on the cause. Treatment may focus on treating an underlying condition that is causing the insomnia. Keep a sleep diary to help you and your health care provider figure out what could be causing your insomnia. This information is not intended to replace advice given to you by your health care provider. Make sure you discuss any questions you have with your health care provider. Document Revised: 03/05/2021 Document Reviewed: 03/05/2021 Elsevier Patient Education  2024 [employer].

## 2022-10-31 NOTE — Progress Notes (Signed)
Subjective:  Patient ID: Karen Pineda, female    DOB: 1961-02-08  Age: 62 y.o. MRN: [id number]  CC:  Chief Complaint  Patient presents with   Annual Exam    Pt is fasting     HPI Karen Pineda presents for Annual Exam PCP, me Gynecology, Dr. Langston Masker, office visit July 23, menopausal syndrome, refilled alprazolam for anxiety. Controlled substance database reviewed, last filled #30 on 05/03/2022, previously #60 on 06/26/2021. Sports medicine, Dr. Pearletha Forge, right thigh pain, visits earlier this year. Hand, Dr. Melvyn Novas, osteoarthritis of first [facility] of left hand, appointment last November. Cardiology, Dr. Tresa Endo, evaluated in 2022 with right bundle branch block, palpitations.  Lumbar spine spondylosis noted on imaging in 2019, severe DDD, disc height loss at L4-5 and L5-S1, facet arthropathy at L5-S1.  She is a runner, was doing well last year.  Previous symptoms of soreness that resolves quickly.  Exercise improves symptoms. No recent flare. Less running.   Situational anxiety Has low-dose alprazolam as needed, infrequent use.  Recently refilled by GYN. 1/2 pill every week or two. Notices more anxiety when not sleeping.  Difficulty staying asleep. Able to get back to sleep. No snoring, no PND. No hx of OSA. Exercise is helpful. No meds.    Hyperlipidemia: Diet/exercise approach. No FH of early heart disease.  The 10-year ASCVD risk score (Arnett DK, et al., 2019) is: 3.3%   Values used to calculate the score:     Age: 45 years     Sex: Female     Is Non-Hispanic African American: No     Diabetic: No     Tobacco smoker: No     Systolic Blood Pressure: 116 mmHg     Is BP treated: No     HDL Cholesterol: 97 mg/dL     Total Cholesterol: 216 mg/dL  Lab Results  Component Value Date   CHOL 216 (H) 08/30/2021   HDL 97.00 08/30/2021   LDLCALC 107 (H) 08/30/2021   TRIG 60.0 08/30/2021   CHOLHDL 2 08/30/2021   Lab Results  Component Value Date   ALT 22 08/30/2021   AST 22  08/30/2021   ALKPHOS 57 08/30/2021   BILITOT 0.5 08/30/2021      Prediabetes: Borderline, diet/exercise approach. Lab Results  Component Value Date   HGBA1C 5.8 08/30/2021   Wt Readings from Last 3 Encounters:  10/31/22 137 lb (62.1 kg)  07/01/22 140 lb (63.5 kg)  06/03/22 140 lb (63.5 kg)        10/31/2022    8:12 AM 08/30/2021    9:02 AM 08/23/2020    2:38 PM 11/02/2018    8:09 AM 09/30/2018    8:10 AM  Depression screen PHQ 2/9  Decreased Interest 0 0 0 0 0  Down, Depressed, Hopeless 0 0 0 0 0  PHQ - 2 Score 0 0 0 0 0  Altered sleeping 1 1     Tired, decreased energy 1 1     Change in appetite 0 0     Feeling bad or failure about yourself  0 0     Trouble concentrating 0 0     Moving slowly or fidgety/restless 0 0     Suicidal thoughts 0 0     PHQ-9 Score 2 2     Difficult doing work/chores Not difficult at all          10/31/2022    8:15 AM  GAD 7 : Generalized Anxiety Score  Nervous, Anxious,  on Edge 1  Control/stop worrying 0  Worry too much - different things 0  Trouble relaxing 0  Restless 0  Easily annoyed or irritable 0  Afraid - awful might happen 0  Total GAD 7 Score 1  Anxiety Difficulty Not difficult at all      Health Maintenance  Topic Date Due   PAP SMEAR-Modifier  09/27/2021   COVID-19 Vaccine (3 - 2023-24 season) 12/07/2021   INFLUENZA VACCINE  11/07/2022   MAMMOGRAM  01/25/2024   DTaP/Tdap/Td (2 - Td or Tdap) 08/19/2027   Colonoscopy  04/21/2029   Hepatitis C Screening  Completed   HIV Screening  Completed   Zoster Vaccines- Shingrix  Completed   HPV VACCINES  Aged Out  Colonoscopy in 2021.  Repeat 5 years Mammogram 01/24/2022, no evidence of malignancy.  Repeat 1 year. Prior hysterectomy. Prior bone density looked ok.    Immunization History  Administered Date(s) Administered   Influenza,inj,Quad PF,6+ Mos 02/13/2017, 02/16/2018, 01/05/2019   Influenza-Unspecified 01/06/2021   PFIZER(Purple Top)SARS-COV-2 Vaccination  07/22/2019, 08/18/2019   PPD Test 02/16/2018   Tdap 08/18/2017   Zoster Recombinant(Shingrix) 02/09/2019, 06/17/2019  Declines COVID vaccination. Disease last July.  RSV vaccine discussed  Flu vaccine recommended in the fall.  No results found. Optho appt next month. Possible early macular degeneration or cataracts?  Dental: Followed every 6 months by her son who is a Education officer, community.  Alcohol: None  Tobacco: None  Exercise: Walking primarily - less running after prior thigh injury.    History Patient Active Problem List   Diagnosis Date Noted   DDD (degenerative disc disease), lumbar 08/18/2017   Situational anxiety 08/18/2017   Ankle pain 12/11/2011   Past Medical History:  Diagnosis Date   Allergy    seasonal   Anxiety    occasional   GERD (gastroesophageal reflux disease)    occasional   Past Surgical History:  Procedure Laterality Date   ABDOMINAL HYSTERECTOMY     No Known Allergies Prior to Admission medications   Medication Sig Start Date End Date Taking? Authorizing Provider  ALPRAZolam (XANAX) 0.25 MG tablet Take 0.125 mg by mouth daily as needed for anxiety.    Yes [provider]  Cholecalciferol (VITAMIN D) 50 MCG (2000 UT) CAPS    Yes [provider]  estradiol (DOTTI) 0.05 MG/24HR patch Place 1 patch onto the skin 2 (two) times a week. 10/29/22  Yes [provider]  famotidine (PEPCID) 10 MG tablet Take 10 mg by mouth 2 (two) times daily.   Yes [provider]  ibuprofen (ADVIL,MOTRIN) 200 MG tablet Take 200 mg by mouth every 6 (six) hours as needed.    Yes [provider]  Multiple Vitamins-Minerals (CENTRUM SILVER PO) Take 1 tablet by mouth daily.   Yes [provider]  Probiotic Product (PROBIOTIC BLEND PO)    Yes [provider]   Social History   Socioeconomic History   Marital status: Married    Spouse name: Not on file   Number of children: Not on file   Years of education: Not on file    Highest education level: Not on file  Occupational History   Not on file  Tobacco Use   Smoking status: Never   Smokeless tobacco: Never  Substance and Sexual Activity   Alcohol use: Never   Drug use: Never   Sexual activity: Not Currently  Other Topics Concern   Not on file  Social History Narrative   Not on file  Social Determinants of Health   Financial Resource Strain: Not on file  Food Insecurity: Not on file  Transportation Needs: Not on file  Physical Activity: Not on file  Stress: Not on file  Social Connections: Not on file  Intimate Partner Violence: Not on file    Review of Systems 13 point review of systems per patient health survey noted.  Negative other than as indicated above or in HPI.    Objective:   Vitals:   10/31/22 0905  BP: 116/60  Pulse: 69  Temp: 97.6 F (36.4 C)  TempSrc: Temporal  SpO2: 98%  Weight: 137 lb (62.1 kg)  Height: 5' 2.5" (1.588 m)     Physical Exam Vitals reviewed.  Constitutional:      Appearance: She is well-developed.  HENT:     Head: Normocephalic and atraumatic.     Right Ear: External ear normal.     Left Ear: External ear normal.  Eyes:     Conjunctiva/sclera: Conjunctivae normal.     Pupils: Pupils are equal, round, and reactive to light.  Neck:     Thyroid: No thyromegaly.  Cardiovascular:     Rate and Rhythm: Normal rate and regular rhythm.     Heart sounds: Normal heart sounds. No murmur heard. Pulmonary:     Effort: Pulmonary effort is normal. No respiratory distress.     Breath sounds: Normal breath sounds. No wheezing.  Abdominal:     General: Bowel sounds are normal.     Palpations: Abdomen is soft.     Tenderness: There is no abdominal tenderness.  Musculoskeletal:        General: No tenderness. Normal range of motion.     Cervical back: Normal range of motion and neck supple.  Lymphadenopathy:     Cervical: No cervical adenopathy.  Skin:    General: Skin is warm and dry.     Findings:  No rash.  Neurological:     Mental Status: She is alert and oriented to person, place, and time.  Psychiatric:        Behavior: Behavior normal.        Thought Content: Thought content normal.     Assessment & Plan:  Dodi Leu is a 62 y.o. female . Annual physical exam - Plan: Hemoglobin A1c, Comprehensive metabolic panel, Lipid panel  --anticipatory guidance as below in AVS, screening labs above. Health maintenance items as above in HPI discussed/recommended as applicable.   Hyperlipidemia, unspecified hyperlipidemia type - Plan: Comprehensive metabolic panel, Lipid panel  -Mild elevation with low ASCVD risk score, continue to watch diet, exercise approach, check updated labs and adjust plan accordingly.  Elevated hemoglobin A1c - Plan: Hemoglobin A1c, Comprehensive metabolic panel  -Borderline prediabetes, check updated A1c, continue to watch diet/exercise.  Situational anxiety  -Overall stable.  Some as above.  Sleep hygiene/insomnia information provided with RTC precautions if persistent.  No orders of the defined types were placed in this encounter.  Patient Instructions  Thank you for coming in today.  I do not have any specific concerns today, see information below on sleep and follow-up if that continues to be an issue.  Continue to try to incorporate exercise throughout the week, follow-up if any new symptoms.  If any concerns on labs I will let you know.  Take care!  Preventive Care 32-43 Years Old, Female Preventive care refers to lifestyle choices and visits with your health care provider that can promote health and wellness. Preventive care visits are  also called wellness exams. What can I expect for my preventive care visit? Counseling Your health care provider may ask you questions about your: Medical history, including: Past medical problems. Family medical history. Pregnancy history. Current health, including: Menstrual cycle. Method of birth  control. Emotional well-being. Home life and relationship well-being. Sexual activity and sexual health. Lifestyle, including: Alcohol, nicotine or tobacco, and drug use. Access to firearms. Diet, exercise, and sleep habits. Work and work Astronomer. Sunscreen use. Safety issues such as seatbelt and bike helmet use. Physical exam Your health care provider will check your: Height and weight. These may be used to calculate your BMI (body mass index). BMI is a measurement that tells if you are at a healthy weight. Waist circumference. This measures the distance around your waistline. This measurement also tells if you are at a healthy weight and may help predict your risk of certain diseases, such as type 2 diabetes and high blood pressure. Heart rate and blood pressure. Body temperature. Skin for abnormal spots. What immunizations do I need?  Vaccines are usually given at various ages, according to a schedule. Your health care provider will recommend vaccines for you based on your age, medical history, and lifestyle or other factors, such as travel or where you work. What tests do I need? Screening Your health care provider may recommend screening tests for certain conditions. This may include: Lipid and cholesterol levels. Diabetes screening. This is done by checking your blood sugar (glucose) after you have not eaten for a while (fasting). Pelvic exam and Pap test. Hepatitis B test. Hepatitis C test. HIV (human immunodeficiency virus) test. STI (sexually transmitted infection) testing, if you are at risk. Lung cancer screening. Colorectal cancer screening. Mammogram. Talk with your health care provider about when you should start having regular mammograms. This may depend on whether you have a family history of breast cancer. BRCA-related cancer screening. This may be done if you have a family history of breast, ovarian, tubal, or peritoneal cancers. Bone density scan. This is  done to screen for osteoporosis. Talk with your health care provider about your test results, treatment options, and if necessary, the need for more tests. Follow these instructions at home: Eating and drinking  Eat a diet that includes fresh fruits and vegetables, whole grains, lean protein, and low-fat dairy products. Take vitamin and mineral supplements as recommended by your health care provider. Do not drink alcohol if: Your health care provider tells you not to drink. You are pregnant, may be pregnant, or are planning to become pregnant. If you drink alcohol: Limit how much you have to 0-1 drink a day. Know how much alcohol is in your drink. In the [place] one drink equals one 12 oz bottle of beer (355 mL), one 5 oz glass of wine (148 mL), or one 1 oz glass of hard liquor (44 mL). Lifestyle Brush your teeth every morning and night with fluoride toothpaste. Floss one time each day. Exercise for at least 30 minutes 5 or more days each week. Do not use any products that contain nicotine or tobacco. These products include cigarettes, chewing tobacco, and vaping devices, such as e-cigarettes. If you need help quitting, ask your health care provider. Do not use drugs. If you are sexually active, practice safe sex. Use a condom or other form of protection to prevent STIs. If you do not wish to become pregnant, use a form of birth control. If you plan to become pregnant, see your health care provider for  a prepregnancy visit. Take aspirin only as told by your health care provider. Make sure that you understand how much to take and what form to take. Work with your health care provider to find out whether it is safe and beneficial for you to take aspirin daily. Find healthy ways to manage stress, such as: Meditation, yoga, or listening to music. Journaling. Talking to a trusted person. Spending time with friends and family. Minimize exposure to UV radiation to reduce your risk of skin  cancer. Safety Always wear your seat belt while driving or riding in a vehicle. Do not drive: If you have been drinking alcohol. Do not ride with someone who has been drinking. When you are tired or distracted. While texting. If you have been using any mind-altering substances or drugs. Wear a helmet and other protective equipment during sports activities. If you have firearms in your house, make sure you follow all gun safety procedures. Seek help if you have been physically or sexually abused. What's next? Visit your health care provider once a year for an annual wellness visit. Ask your health care provider how often you should have your eyes and teeth checked. Stay up to date on all vaccines. This information is not intended to replace advice given to you by your health care provider. Make sure you discuss any questions you have with your health care provider. Document Revised: 09/20/2020 Document Reviewed: 09/20/2020 Elsevier Patient Education  2024 Elsevier Inc.   See info on sleep below, but please follow up if persistent difficulty with sleep.  There is a recommendation for RSV vaccine for patients over age 66.   Typically would recommend the RSV vaccine as most important for patients over age 23 that have comorbidities that put them at increased risk for severe disease (heart disease such as congestive heart failure, coronary artery disease, lung disease such as asthma or COPD, kidney disease, liver disease, diabetes, chronic or progressive neurologic or muscular conditions, immunosuppressed, or being frail or of advanced age).  For others that do not have these risk factors, there still is some benefit from vaccination since age is one of the main risk factors for developing severe disease however baseline risk of developing severe disease and requiring hospitalization is likely to be lower compared to those that have comorbidities in addition to age.   CDC does have some information  as well: [employer]  Insomnia Insomnia is a sleep disorder that makes it difficult to fall asleep or stay asleep. Insomnia can cause fatigue, low energy, difficulty concentrating, mood swings, and poor performance at work or school. There are three different ways to classify insomnia: Difficulty falling asleep. Difficulty staying asleep. Waking up too early in the morning. Any type of insomnia can be long-term (chronic) or short-term (acute). Both are common. Short-term insomnia usually lasts for 3 months or less. Chronic insomnia occurs at least three times a week for longer than 3 months. What are the causes? Insomnia may be caused by another condition, situation, or substance, such as: Having certain mental health conditions, such as anxiety and depression. Using caffeine, alcohol, tobacco, or drugs. Having gastrointestinal conditions, such as gastroesophageal reflux disease (GERD). Having certain medical conditions. These include: Asthma. Alzheimer's disease. Stroke. Chronic pain. An overactive thyroid gland (hyperthyroidism). Other sleep disorders, such as restless legs syndrome and sleep apnea. Menopause. Sometimes, the cause of insomnia may not be known. What increases the risk? Risk factors for insomnia include: Gender. Females are affected more often than males. Age. Insomnia  is more common as people get older. Stress and certain medical and mental health conditions. Lack of exercise. Having an irregular work schedule. This may include working night shifts and traveling between different time zones. What are the signs or symptoms? If you have insomnia, the main symptom is having trouble falling asleep or having trouble staying asleep. This may lead to other symptoms, such as: Feeling tired or having low energy. Feeling nervous about going to sleep. Not feeling rested in the morning. Having trouble concentrating. Feeling  irritable, anxious, or depressed. How is this diagnosed? This condition may be diagnosed based on: Your symptoms and medical history. Your health care provider may ask about: Your sleep habits. Any medical conditions you have. Your mental health. A physical exam. How is this treated? Treatment for insomnia depends on the cause. Treatment may focus on treating an underlying condition that is causing the insomnia. Treatment may also include: Medicines to help you sleep. Counseling or therapy. Lifestyle adjustments to help you sleep better. Follow these instructions at home: Eating and drinking  Limit or avoid alcohol, caffeinated beverages, and products that contain nicotine and tobacco, especially close to bedtime. These can disrupt your sleep. Do not eat a large meal or eat spicy foods right before bedtime. This can lead to digestive discomfort that can make it hard for you to sleep. Sleep habits  Keep a sleep diary to help you and your health care provider figure out what could be causing your insomnia. Write down: When you sleep. When you wake up during the night. How well you sleep and how rested you feel the next day. Any side effects of medicines you are taking. What you eat and drink. Make your bedroom a dark, comfortable place where it is easy to fall asleep. Put up shades or blackout curtains to block light from outside. Use a white noise machine to block noise. Keep the temperature cool. Limit screen use before bedtime. This includes: Not watching TV. Not using your smartphone, tablet, or computer. Stick to a routine that includes going to bed and waking up at the same times every day and night. This can help you fall asleep faster. Consider making a quiet activity, such as reading, part of your nighttime routine. Try to avoid taking naps during the day so that you sleep better at night. Get out of bed if you are still awake after 15 minutes of trying to sleep. Keep the  lights down, but try reading or doing a quiet activity. When you feel sleepy, go back to bed. General instructions Take over-the-counter and prescription medicines only as told by your health care provider. Exercise regularly as told by your health care provider. However, avoid exercising in the hours right before bedtime. Use relaxation techniques to manage stress. Ask your health care provider to suggest some techniques that may work well for you. These may include: Breathing exercises. Routines to release muscle tension. Visualizing peaceful scenes. Make sure that you drive carefully. Do not drive if you feel very sleepy. Keep all follow-up visits. This is important. Contact a health care provider if: You are tired throughout the day. You have trouble in your daily routine due to sleepiness. You continue to have sleep problems, or your sleep problems get worse. Get help right away if: You have thoughts about hurting yourself or someone else. Get help right away if you feel like you may hurt yourself or others, or have thoughts about taking your own life. Go to your nearest  emergency room or: Call 911. Call the National Suicide Prevention Lifeline at (940) 583-1440 or 988. This is open 24 hours a day. Text the Crisis Text Line at 939-685-0194. Summary Insomnia is a sleep disorder that makes it difficult to fall asleep or stay asleep. Insomnia can be long-term (chronic) or short-term (acute). Treatment for insomnia depends on the cause. Treatment may focus on treating an underlying condition that is causing the insomnia. Keep a sleep diary to help you and your health care provider figure out what could be causing your insomnia. This information is not intended to replace advice given to you by your health care provider. Make sure you discuss any questions you have with your health care provider. Document Revised: 03/05/2021 Document Reviewed: 03/05/2021 Elsevier Patient Education  2024  Elsevier Inc.        Signed,   Meredith Staggers, MD [facility], [facility] 10/31/22 9:07 AM

## 2022-11-15 ENCOUNTER — Telehealth: Payer: BC Managed Care – PPO | Admitting: Physician Assistant

## 2022-11-15 DIAGNOSIS — U071 COVID-19: Secondary | ICD-10-CM

## 2022-11-15 MED ORDER — NIRMATRELVIR/RITONAVIR (PAXLOVID)TABLET: 3.0000 | ORAL_TABLET | Freq: Two times a day (BID) | ORAL | 0 refills | Status: AC

## 2022-11-15 NOTE — Patient Instructions (Signed)
Karen Pineda, thank you for joining Karen Loveless, PA-C for today's virtual visit.  While this provider is not your primary care provider (PCP), if your PCP is located in our provider database this encounter information will be shared with them immediately following your visit.   [name] account gives you access to today's visit and all your visits, tests, and labs performed at [facility] " click here if you don't have a [facility] MyChart account or go to [email or web address]  Consent: (Patient) Karen Pineda provided verbal consent for this virtual visit at the beginning of the encounter.  Current Medications:  Current Outpatient Medications:    ALPRAZolam (XANAX) 0.25 MG tablet, Take 0.125 mg by mouth daily as needed for anxiety. , Disp: , Rfl:    Cholecalciferol (VITAMIN D) 50 MCG (2000 UT) CAPS, , Disp: , Rfl:    estradiol (DOTTI) 0.05 MG/24HR patch, Place 1 patch onto the skin 2 (two) times a week., Disp: , Rfl:    famotidine (PEPCID) 10 MG tablet, Take 10 mg by mouth 2 (two) times daily., Disp: , Rfl:    ibuprofen (ADVIL,MOTRIN) 200 MG tablet, Take 200 mg by mouth every 6 (six) hours as needed. , Disp: , Rfl:    Multiple Vitamins-Minerals (CENTRUM SILVER PO), Take 1 tablet by mouth daily., Disp: , Rfl:    nirmatrelvir/ritonavir (PAXLOVID) 20 x 150 MG & 10 x 100MG  TABS, Take 3 tablets by mouth 2 (two) times daily for 5 days. (Take nirmatrelvir 150 mg two tablets twice daily for 5 days and ritonavir 100 mg one tablet twice daily for 5 days) Patient GFR is 76, Disp: 30 tablet, Rfl: 0   Probiotic Product (PROBIOTIC BLEND PO), , Disp: , Rfl:    Medications ordered in this encounter:  Meds ordered this encounter  Medications   nirmatrelvir/ritonavir (PAXLOVID) 20 x 150 MG & 10 x 100MG  TABS    Sig: Take 3 tablets by mouth 2 (two) times daily for 5 days. (Take nirmatrelvir 150 mg two tablets twice daily for 5 days and ritonavir 100 mg one  tablet twice daily for 5 days) Patient GFR is 76    Dispense:  30 tablet    Refill:  0    Please put on file; Patient will contact by Sunday if needed    Order Specific Question:   Supervising Provider    Answer:   Merrilee Jansky [[id number]]     *If you need refills on other medications prior to your next appointment, please contact your pharmacy*  Follow-Up: Call back or seek an in-person evaluation if the symptoms worsen or if the condition fails to improve as anticipated.  [facility] [phone]  Care Instructions: Paxlovid (Nirmatrelvir; Ritonavir) Tablets What is this medication? NIRMATRELVIR; RITONAVIR (NIR ma TREL vir; ri TOE na veer) treats mild to moderate COVID-19. It may help people who are at high risk of developing severe illness. It works by limiting the spread of the virus in your body. This medicine may be used for other purposes; ask your health care provider or pharmacist if you have questions. COMMON BRAND NAME(S): PAXLOVID What should I tell my care team before I take this medication? They need to know if you have any of these conditions: Any allergies Any serious illness Kidney disease Liver disease An unusual or allergic reaction to nirmatrelvir, ritonavir, other medications, foods, dyes, or preservatives Pregnant or trying to get pregnant Breast-feeding How should I use this medication?  This product contains 2 different medications that are packaged together. For the standard dose, take 2 pink tablets of nirmatrelvir with 1 white tablet of ritonavir (3 tablets total) by mouth with water twice daily. Talk to your care team if you have kidney disease. You may need a different dose. Swallow the tablets whole. You can take it with or without food. If it upsets your stomach, take it with food. Take all of this medication unless your care team tells you to stop it early. Keep taking it even if you think you are better. Talk to your care team about  the use of this medication in children. While it may be prescribed for children as young as 12 years for selected conditions, precautions do apply. Overdosage: If you think you have taken too much of this medicine contact a poison control center or emergency room at once. NOTE: This medicine is only for you. Do not share this medicine with others. What if I miss a dose? If you miss a dose, take it as soon as you can unless it is more than 8 hours late. If it is more than 8 hours late, skip the missed dose. Take the next dose at the normal time. Do not take extra or 2 doses at the same time to make up for the missed dose. What may interact with this medication? Do not take this medication with any of the following: Alfuzosin Certain medications for anxiety or sleep, such as midazolam or triazolam Certain medications for cancer, such as apalutamide Certain medications for cholesterol, such as lovastatin or simvastatin Certain medications for irregular heartbeat, such as amiodarone, dronedarone, flecainide, propafenone, quinidine Certain medications for mental health conditions, such as lurasidone or pimozide Certain medications for seizures, such as carbamazepine, phenobarbital, phenytoin, primidone Colchicine Eletriptan Eplerenone Ergot alkaloids, such as dihydroergotamine, ergotamine, methylergonovine Finerenone Flibanserin Ivabradine Lomitapide Lumacaftor; ivacaftor Naloxegol Ranolazine Red Yeast Rice Rifampin Rifapentine Sildenafil Silodosin [facility]. John's wort Tolvaptan Ubrogepant Voclosporin This medication may affect how other medications work, and other medications may affect the way this medication works. Talk with your care team about all of the medications you take. They may suggest changes to your treatment plan to lower the risk of side effects and to make sure your medications work as intended. This list may not describe all possible interactions. Give your health care  provider a list of all the medicines, herbs, non-prescription drugs, or dietary supplements you use. Also tell them if you smoke, drink alcohol, or use illegal drugs. Some items may interact with your medicine. What should I watch for while using this medication? Your condition will be monitored carefully while you are receiving this medication. Visit your care team for regular checkups. Tell your care team if your symptoms do not start to get better or if they get worse. If you have untreated HIV infection, this medication may lead to some HIV medications not working as well in the future. Estrogen and progestin hormones may not work as well while you are taking this medication. Your care team can help you find the contraceptive option that works for you. What side effects may I notice from receiving this medication? Side effects that you should report to your care team as soon as possible: Allergic reactions--skin rash, itching, hives, swelling of the face, lips, tongue, or throat Liver injury--right upper belly pain, loss of appetite, nausea, light-colored stool, dark yellow or brown urine, yellowing skin or eyes, unusual weakness or fatigue Redness, blistering, peeling, or loosening  of the skin, including inside the mouth Side effects that usually do not require medical attention (report these to your care team if they continue or are bothersome): Change in taste Diarrhea General discomfort and fatigue Increase in blood pressure Muscle pain Nausea Stomach pain This list may not describe all possible side effects. Call your doctor for medical advice about side effects. You may report side effects to FDA at 1-800-FDA-1088. Where should I keep my medication? Keep out of the reach of children and pets. Store at room temperature between 20 and 25 degrees C (68 and 77 degrees F). Get rid of any unused medication after the expiration date. To get rid of medications that are no longer needed or  have expired: Take the medication to a medication take-back program. Check with your pharmacy or law enforcement to find a location. If you cannot return the medication, check the label or package insert to see if the medication should be thrown out in the garbage or flushed down the toilet. If you are not sure, ask your care team. If it is safe to put it in the trash, take the medication out of the container. Mix the medication with cat litter, dirt, coffee grounds, or other unwanted substance. Seal the mixture in a bag or container. Put it in the trash. NOTE: This sheet is a summary. It may not cover all possible information. If you have questions about this medicine, talk to your doctor, pharmacist, or health care provider.  2024 Elsevier/Gold Standard (2022-05-13 00:00:00)    Isolation Instructions: You are to isolate at home until you have been fever free for at least 24 hours without a fever-reducing medication, and symptoms have been steadily improving for 24 hours. At that time,  you can end isolation but need to mask for an additional 5 days.   If you must be around other household members who do not have symptoms, you need to make sure that both you and the family members are masking consistently with a high-quality mask.  If you note any worsening of symptoms despite treatment, please seek an in-person evaluation ASAP. If you note any significant shortness of breath or any chest pain, please seek ER evaluation. Please do not delay care!   COVID-19: What to Do if You Are Sick If you test positive and are an older adult or someone who is at high risk of getting very sick from COVID-19, treatment may be available. Contact a healthcare provider right away after a positive test to determine if you are eligible, even if your symptoms are mild right now. You can also visit a Test to Treat location and, if eligible, receive a prescription from a provider. Don't delay: Treatment must be started  within the first few days to be effective. If you have a fever, cough, or other symptoms, you might have COVID-19. Most people have mild illness and are able to recover at home. If you are sick: Keep track of your symptoms. If you have an emergency warning sign (including trouble breathing), call 911. Steps to help prevent the spread of COVID-19 if you are sick If you are sick with COVID-19 or think you might have COVID-19, follow the steps below to care for yourself and to help protect other people in your home and community. Stay home except to get medical care Stay home. Most people with COVID-19 have mild illness and can recover at home without medical care. Do not leave your home, except to get medical care.  Do not visit public areas and do not go to places where you are unable to wear a mask. Take care of yourself. Get rest and stay hydrated. Take over-the-counter medicines, such as acetaminophen, to help you feel better. Stay in touch with your doctor. Call before you get medical care. Be sure to get care if you have trouble breathing, or have any other emergency warning signs, or if you think it is an emergency. Avoid public transportation, ride-sharing, or taxis if possible. Get tested If you have symptoms of COVID-19, get tested. While waiting for test results, stay away from others, including staying apart from those living in your household. Get tested as soon as possible after your symptoms start. Treatments may be available for people with COVID-19 who are at risk for becoming very sick. Don't delay: Treatment must be started early to be effective--some treatments must begin within 5 days of your first symptoms. Contact your healthcare provider right away if your test result is positive to determine if you are eligible. Self-tests are one of several options for testing for the virus that causes COVID-19 and may be more convenient than laboratory-based tests and point-of-care tests. Ask  your healthcare provider or your local health department if you need help interpreting your test results. You can visit your state, tribal, local, and territorial health department's website to look for the latest local information on testing sites. Separate yourself from other people As much as possible, stay in a specific room and away from other people and pets in your home. If possible, you should use a separate bathroom. If you need to be around other people or animals in or outside of the home, wear a well-fitting mask. Tell your close contacts that they may have been exposed to COVID-19. An infected person can spread COVID-19 starting 48 hours (or 2 days) before the person has any symptoms or tests positive. By letting your close contacts know they may have been exposed to COVID-19, you are helping to protect everyone. See COVID-19 and Animals if you have questions about pets. If you are diagnosed with COVID-19, someone from the health department may call you. Answer the call to slow the spread. Monitor your symptoms Symptoms of COVID-19 include fever, cough, or other symptoms. Follow care instructions from your healthcare provider and local health department. Your local health authorities may give instructions on checking your symptoms and reporting information. When to seek emergency medical attention Look for emergency warning signs* for COVID-19. If someone is showing any of these signs, seek emergency medical care immediately: Trouble breathing Persistent pain or pressure in the chest New confusion Inability to wake or stay awake Pale, gray, or blue-colored skin, lips, or nail beds, depending on skin tone *This list is not all possible symptoms. Please call your medical provider for any other symptoms that are severe or concerning to you. Call 911 or call ahead to your local emergency facility: Notify the operator that you are seeking care for someone who has or may have COVID-19. Call  ahead before visiting your doctor Call ahead. Many medical visits for routine care are being postponed or done by phone or telemedicine. If you have a medical appointment that cannot be postponed, call your doctor's office, and tell them you have or may have COVID-19. This will help the office protect themselves and other patients. If you are sick, wear a well-fitting mask You should wear a mask if you must be around other people or animals, including pets (even  at home). Wear a mask with the best fit, protection, and comfort for you. You don't need to wear the mask if you are alone. If you can't put on a mask (because of trouble breathing, for example), cover your coughs and sneezes in some other way. Try to stay at least 6 feet away from other people. This will help protect the people around you. Masks should not be placed on young children under age 41 years, anyone who has trouble breathing, or anyone who is not able to remove the mask without help. Cover your coughs and sneezes Cover your mouth and nose with a tissue when you cough or sneeze. Throw away used tissues in a lined trash can. Immediately wash your hands with soap and water for at least 20 seconds. If soap and water are not available, clean your hands with an alcohol-based hand sanitizer that contains at least 60% alcohol. Clean your hands often Wash your hands often with soap and water for at least 20 seconds. This is especially important after blowing your nose, coughing, or sneezing; going to the bathroom; and before eating or preparing food. Use hand sanitizer if soap and water are not available. Use an alcohol-based hand sanitizer with at least 60% alcohol, covering all surfaces of your hands and rubbing them together until they feel dry. Soap and water are the best option, especially if hands are visibly dirty. Avoid touching your eyes, nose, and mouth with unwashed hands. Handwashing Tips Avoid sharing personal household  items Do not share dishes, drinking glasses, cups, eating utensils, towels, or bedding with other people in your home. Wash these items thoroughly after using them with soap and water or put in the dishwasher. Clean surfaces in your home regularly Clean and disinfect high-touch surfaces (for example, doorknobs, tables, handles, light switches, and countertops) in your "sick room" and bathroom. In shared spaces, you should clean and disinfect surfaces and items after each use by the person who is ill. If you are sick and cannot clean, a caregiver or other person should only clean and disinfect the area around you (such as your bedroom and bathroom) on an as needed basis. Your caregiver/other person should wait as long as possible (at least several hours) and wear a mask before entering, cleaning, and disinfecting shared spaces that you use. Clean and disinfect areas that may have blood, stool, or body fluids on them. Use household cleaners and disinfectants. Clean visible dirty surfaces with household cleaners containing soap or detergent. Then, use a household disinfectant. Use a product from [employer] List N: Disinfectants for Coronavirus (COVID-19). Be sure to follow the instructions on the label to ensure safe and effective use of the product. Many products recommend keeping the surface wet with a disinfectant for a certain period of time (look at "contact time" on the product label). You may also need to wear personal protective equipment, such as gloves, depending on the directions on the product label. Immediately after disinfecting, wash your hands with soap and water for 20 seconds. For completed guidance on cleaning and disinfecting your home, visit Complete Disinfection Guidance. Take steps to improve ventilation at home Improve ventilation (air flow) at home to help prevent from spreading COVID-19 to other people in your household. Clear out COVID-19 virus particles in the air by opening  windows, using air filters, and turning on fans in your home. Use this interactive tool to learn how to improve air flow in your home. When you can be around others after  being sick with COVID-19 Deciding when you can be around others is different for different situations. Find out when you can safely end home isolation. For any additional questions about your care, contact your healthcare provider or state or local health department. 06/27/2020 Content source: [facility] for Immunization and Respiratory Diseases ([facility]), Division of Viral Diseases This information is not intended to replace advice given to you by your health care provider. Make sure you discuss any questions you have with your health care provider. Document Revised: 08/10/2020 Document Reviewed: 08/10/2020 Elsevier Patient Education  2022 [employer].     If you have been instructed to have an in-person evaluation today at a local Urgent Care facility, please use the link below. It will take you to a list of all of our available [place] [facility] [facility], including address, phone number and hours of operation. Please do not delay care.  [facility]  If you or a family member do not have a primary care provider, use the link below to schedule a visit and establish care. When you choose a [facility] primary care physician or advanced practice provider, you gain a long-term partner in health. Find a Primary Care Provider  Learn more about [facility]'s in-office and virtual care options:  - Get Care Now

## 2022-11-15 NOTE — Progress Notes (Signed)
Virtual Visit Consent   Karen Pineda, you are scheduled for a virtual visit with a [facility] provider today. Just as with appointments in the office, your consent must be obtained to participate. Your consent will be active for this visit and any virtual visit you may have with one of our providers in the next 365 days. If you have a MyChart account, a copy of this consent can be sent to you electronically.  As this is a virtual visit, video technology does not allow for your provider to perform a traditional examination. This may limit your provider's ability to fully assess your condition. If your provider identifies any concerns that need to be evaluated in person or the need to arrange testing (such as labs, EKG, etc.), we will make arrangements to do so. Although advances in technology are sophisticated, we cannot ensure that it will always work on either your end or our end. If the connection with a video visit is poor, the visit may have to be switched to a telephone visit. With either a video or telephone visit, we are not always able to ensure that we have a secure connection.  By engaging in this virtual visit, you consent to the provision of healthcare and authorize for your insurance to be billed (if applicable) for the services provided during this visit. Depending on your insurance coverage, you may receive a charge related to this service.  I need to obtain your verbal consent now. Are you willing to proceed with your visit today? [name] has provided verbal consent on 11/15/2022 for a virtual visit (video or telephone). Margaretann Loveless, PA-C  Date: 11/15/2022 8:13 AM  Virtual Visit via Video Note   I, Margaretann Loveless, connected with  Karen Pineda  ([id number], 1960/10/17) on 11/15/22 at  8:15 AM EDT by a video-enabled telemedicine application and verified that I am speaking with the correct person using two identifiers.  Location: Patient: Virtual Visit  Location Patient: Home Provider: Virtual Visit Location Provider: Home Office   I discussed the limitations of evaluation and management by telemedicine and the availability of in person appointments. The patient expressed understanding and agreed to proceed.    History of Present Illness: Karen Pineda is a 62 y.o. who identifies as a female who was assigned female at birth, and is being seen today for Covid 19.  HPI: URI  This is a new problem. Episode onset: Tested positive for Covid 19 on at home test; Symptoms started on Wednesday. The problem has been gradually worsening. Maximum temperature: subjective fevers. Associated symptoms include congestion, coughing, diarrhea, headaches, a plugged ear sensation (mild), rhinorrhea, sinus pain and a sore throat (slightly). Pertinent negatives include no ear pain, nausea or vomiting. Associated symptoms comments: Myalgias, fatigue, chills. She has tried NSAIDs and increased fluids for the symptoms. The treatment provided no relief.      Problems:  Patient Active Problem List   Diagnosis Date Noted   DDD (degenerative disc disease), lumbar 08/18/2017   Situational anxiety 08/18/2017   Ankle pain 12/11/2011    Allergies: No Known Allergies Medications:  Current Outpatient Medications:    ALPRAZolam (XANAX) 0.25 MG tablet, Take 0.125 mg by mouth daily as needed for anxiety. , Disp: , Rfl:    Cholecalciferol (VITAMIN D) 50 MCG (2000 UT) CAPS, , Disp: , Rfl:    estradiol (DOTTI) 0.05 MG/24HR patch, Place 1 patch onto the skin 2 (two) times a week., Disp: , Rfl:  famotidine (PEPCID) 10 MG tablet, Take 10 mg by mouth 2 (two) times daily., Disp: , Rfl:    ibuprofen (ADVIL,MOTRIN) 200 MG tablet, Take 200 mg by mouth every 6 (six) hours as needed. , Disp: , Rfl:    Multiple Vitamins-Minerals (CENTRUM SILVER PO), Take 1 tablet by mouth daily., Disp: , Rfl:    nirmatrelvir/ritonavir (PAXLOVID) 20 x 150 MG & 10 x 100MG  TABS, Take 3 tablets by  mouth 2 (two) times daily for 5 days. (Take nirmatrelvir 150 mg two tablets twice daily for 5 days and ritonavir 100 mg one tablet twice daily for 5 days) Patient GFR is 76, Disp: 30 tablet, Rfl: 0   Probiotic Product (PROBIOTIC BLEND PO), , Disp: , Rfl:   Observations/Objective: Patient is well-developed, well-nourished in no acute distress.  Resting comfortably at home.  Head is normocephalic, atraumatic.  No labored breathing.  Speech is clear and coherent with logical content.  Patient is alert and oriented at baseline.    Assessment and Plan: 1. COVID-19 - nirmatrelvir/ritonavir (PAXLOVID) 20 x 150 MG & 10 x 100MG  TABS; Take 3 tablets by mouth 2 (two) times daily for 5 days. (Take nirmatrelvir 150 mg two tablets twice daily for 5 days and ritonavir 100 mg one tablet twice daily for 5 days) Patient GFR is 76  Dispense: 30 tablet; Refill: 0 - MyChart COVID-19 home monitoring program; Future  - Continue OTC symptomatic management of choice - Will send OTC vitamins and supplement information through AVS - Paxlovid prescribed, but holding to see if symptoms worsen - Patient enrolled in MyChart symptom monitoring - Push fluids - Rest as needed - Discussed return precautions and when to seek in-person evaluation, sent via AVS as well   Follow Up Instructions: I discussed the assessment and treatment plan with the patient. The patient was provided an opportunity to ask questions and all were answered. The patient agreed with the plan and demonstrated an understanding of the instructions.  A copy of instructions were sent to the patient via MyChart unless otherwise noted below.    The patient was advised to call back or seek an in-person evaluation if the symptoms worsen or if the condition fails to improve as anticipated.  Time:  I spent 10 minutes with the patient via telehealth technology discussing the above problems/concerns.    Margaretann Loveless, PA-C

## 2022-11-26 DIAGNOSIS — H524 Presbyopia: Secondary | ICD-10-CM | POA: Diagnosis not present

## 2022-11-26 DIAGNOSIS — H04123 Dry eye syndrome of bilateral lacrimal glands: Secondary | ICD-10-CM | POA: Diagnosis not present

## 2022-11-26 DIAGNOSIS — H2513 Age-related nuclear cataract, bilateral: Secondary | ICD-10-CM | POA: Diagnosis not present

## 2022-12-30 ENCOUNTER — Other Ambulatory Visit: Payer: Self-pay | Admitting: Obstetrics & Gynecology

## 2022-12-30 DIAGNOSIS — Z1231 Encounter for screening mammogram for malignant neoplasm of breast: Secondary | ICD-10-CM

## 2023-01-09 ENCOUNTER — Ambulatory Visit: Payer: BC Managed Care – PPO | Admitting: Family Medicine

## 2023-01-09 ENCOUNTER — Encounter: Payer: Self-pay | Admitting: Family Medicine

## 2023-01-09 VITALS — BP 108/64 | HR 73 | Temp 97.8°F | Wt 142.0 lb

## 2023-01-09 DIAGNOSIS — H9313 Tinnitus, bilateral: Secondary | ICD-10-CM | POA: Diagnosis not present

## 2023-01-09 DIAGNOSIS — H9190 Unspecified hearing loss, unspecified ear: Secondary | ICD-10-CM

## 2023-01-09 NOTE — Progress Notes (Signed)
Subjective:  Patient ID: Karen Pineda, female    DOB: 1960-10-26  Age: 62 y.o. MRN: [id number]  CC:  Chief Complaint  Patient presents with   Tinnitus    Going on since August when had COVID. In both ears. Restarted flonase and claritin.     HPI [name] presents for   Tinnitus: Noted past 2 months. Occurred after COVID infection, both ears. High pitch sound. Persistent, not noticed as much around other noise, usually in quiet room. Not affecting sleep - has floor fan.  No pulsatile tinnitus.  No ear pain. No new HA.  No change in hearing noted recently, possibly decreased some over the years. No prior testing - has noticed looking at mouths when others speaking.  Tried restarted flonase and claritin - no changes.  No prior ENT eval or audiology testing.    History Patient Active Problem List   Diagnosis Date Noted   DDD (degenerative disc disease), lumbar 08/18/2017   Situational anxiety 08/18/2017   Ankle pain 12/11/2011   Past Medical History:  Diagnosis Date   Allergy    seasonal   Anxiety    occasional   GERD (gastroesophageal reflux disease)    occasional   Past Surgical History:  Procedure Laterality Date   ABDOMINAL HYSTERECTOMY     No Known Allergies Prior to Admission medications   Medication Sig Start Date End Date Taking? Authorizing Provider  ALPRAZolam (XANAX) 0.25 MG tablet Take 0.125 mg by mouth daily as needed for anxiety.    Yes [provider]  Cholecalciferol (VITAMIN D) 50 MCG (2000 UT) CAPS    Yes [provider]  estradiol (DOTTI) 0.05 MG/24HR patch Place 1 patch onto the skin 2 (two) times a week. 10/29/22  Yes [provider]  famotidine (PEPCID) 10 MG tablet Take 10 mg by mouth 2 (two) times daily.   Yes [provider]  ibuprofen (ADVIL,MOTRIN) 200 MG tablet Take 200 mg by mouth every 6 (six) hours as needed.    Yes [provider]  loratadine (CLARITIN) 10 MG tablet Take 10 mg  by mouth daily as needed for allergies.   Yes [provider]  Multiple Vitamins-Minerals (CENTRUM SILVER PO) Take 1 tablet by mouth daily.   Yes [provider]  Probiotic Product (PROBIOTIC BLEND PO)    Yes [provider]   Social History   Socioeconomic History   Marital status: Married    Spouse name: Not on file   Number of children: Not on file   Years of education: Not on file   Highest education level: Professional school degree (e.g., MD, DDS, DVM, JD)  Occupational History   Not on file  Tobacco Use   Smoking status: Never   Smokeless tobacco: Never  Substance and Sexual Activity   Alcohol use: Never   Drug use: Never   Sexual activity: Not Currently  Other Topics Concern   Not on file  Social History Narrative   Not on file   Social Determinants of Health   Financial Resource Strain: Low Risk  (01/06/2023)   Overall Financial Resource Strain (CARDIA)    Difficulty of Paying Living Expenses: Not hard at all  Food Insecurity: No Food Insecurity (01/06/2023)   Hunger Vital Sign    Worried About Running Out of Food in the Last Year: Never true    Ran Out of Food in the Last Year: Never true  Transportation Needs: No Transportation Needs (01/06/2023)   PRAPARE -  Administrator, Civil Service (Medical): No    Lack of Transportation (Non-Medical): No  Physical Activity: Sufficiently Active (01/06/2023)   Exercise Vital Sign    Days of Exercise per Week: 4 days    Minutes of Exercise per Session: 40 min  Stress: No Stress Concern Present (01/06/2023)   [employer] of Occupational Health - Occupational Stress Questionnaire    Feeling of Stress : Only a little  Social Connections: Socially Integrated (01/06/2023)   Social Connection and Isolation Panel [NHANES]    Frequency of Communication with Friends and Family: More than three times a week    Frequency of Social Gatherings with Friends and Family: More than three times a  week    Attends Religious Services: More than 4 times per year    Active Member of [employer] or Organizations: Yes    Attends Engineer, structural: More than 4 times per year    Marital Status: Married  Catering manager Violence: Not on file    Review of Systems   Objective:   Vitals:   01/09/23 0852  BP: 108/64  Pulse: 73  Temp: 97.8 F (36.6 C)  TempSrc: Temporal  SpO2: 97%  Weight: 142 lb (64.4 kg)     Physical Exam Vitals reviewed.  Constitutional:      General: She is not in acute distress.    Appearance: She is well-developed.  HENT:     Head: Normocephalic and atraumatic.     Right Ear: Hearing, tympanic membrane, ear canal and external ear normal.     Left Ear: Hearing, tympanic membrane, ear canal and external ear normal.     Nose: Nose normal.     Mouth/Throat:     Pharynx: No posterior oropharyngeal erythema.  Eyes:     Conjunctiva/sclera: Conjunctivae normal.     Pupils: Pupils are equal, round, and reactive to light.  Neck:     Vascular: No carotid bruit.  Cardiovascular:     Rate and Rhythm: Normal rate and regular rhythm.     Heart sounds: Normal heart sounds. No murmur heard. Pulmonary:     Effort: Pulmonary effort is normal. No respiratory distress.     Breath sounds: Normal breath sounds. No wheezing or rhonchi.  Skin:    General: Skin is warm and dry.     Findings: No rash.  Neurological:     Mental Status: She is alert and oriented to person, place, and time.  Psychiatric:        Mood and Affect: Mood normal.        Behavior: Behavior normal.        Assessment & Plan:  Karen Pineda is a 62 y.o. female . Tinnitus of both ears - Plan: Ambulatory referral to Audiology, Ambulatory referral to ENT  Decreased hearing, unspecified laterality - Plan: Ambulatory referral to Audiology, Ambulatory referral to ENT  Possible slight decreased hearing past few years, no recent acute changes.  New onset tinnitus past 2 months,  possible postinfectious as noted after COVID infection.  Exam reassuring.  Will refer to audiology and ENT, riboflavin over-the-counter as option, but may or may not be helpful.  RTC precautions if any acute changes.  No orders of the defined types were placed in this encounter.  Patient Instructions  I will refer you to audiology, and ENT. See info below. Riboflavin supplement over the counter is an option. Return to the clinic or go to the nearest emergency room if any of  your symptoms worsen or new symptoms occur. Let me know if there are questions.   Tinnitus Tinnitus is when you hear a sound that there's no actual source for. It may sound like ringing in your ears or something else. The sound may be loud, soft, or somewhere in between. It can last for a few seconds or be constant for days. It can come and go. Almost everyone has tinnitus at some point. It's not the same as hearing loss. But you may need to see a health care provider if: It lasts for a long time. It comes back often. You have trouble sleeping and focusing. What are the causes? The cause of tinnitus is often unknown. In some cases, you may get it if: You're around loud noises, such as from machines or music. An object gets stuck in your ear. Earwax builds up in Landscape architect. You drink a lot of alcohol or caffeine. You take certain medicines. You start to lose your hearing. You may also get it from some medical conditions. These may include: Ear or sinus infections. Heart diseases. High blood pressure. Allergies. Mnire's disease. Problems with your thyroid. A tumor. This is a growth of cells that isn't normal. A weak, bulging blood vessel called an aneurysm near your ear. What increases the risk? You may be more likely to get tinnitus if: You're around loud noises a lot. You're older. You drink alcohol. You smoke. What are the signs or symptoms? The main symptom is hearing a sound that there's no source for. It  may sound like ringing. It may also sound like: Buzzing. Sizzling. Blowing air. Hissing. Whistling. Other sounds may include: Roaring. Running water. A musical note. Tapping. Humming. You may have symptoms in one ear or both ears. How is this diagnosed? Tinnitus is diagnosed based on your symptoms, your medical history, and an exam. Your provider may do a full hearing test if your tinnitus: Is in just one ear. Makes it hard for you to hear. Lasts 6 months or longer. You may also need to see an expert in hearing disorders called an audiologist. They may ask you about your symptoms and how tinnitus affects your daily life. You may have other tests done. These may include: A CT scan. An MRI. An angiogram. This shows how blood flows through your blood vessels. How is this treated? Treatment may include: Therapy to help you manage the stress of living with tinnitus. Finding ways to mask or cover the sound of tinnitus. These include: Sound or white noise machines. Devices that fit in your ear and play sounds or music. A loud humidifier. Acoustic neural stimulation. This is when you use headphones to listen to music that has a special signal in it. Over time, this signal may change some of the pathways in your brain. This can make you less sensitive to tinnitus. This treatment is used for very severe cases. Using hearing aids or cochlear implants if your tinnitus is from hearing loss. If your tinnitus is caused by a medical condition, treating the condition may make it go away.  Follow these instructions at home: Managing symptoms     Try to avoid being in loud places or around loud noises. Wear earplugs or headphones when you're around loud noises. Find ways to reduce stress. These may include meditation, yoga, or deep breathing. Sleep with your head slightly raised. General instructions Take over-the-counter and prescription medicines only as told by your provider. Track the  things that cause symptoms (triggers).  Try to avoid these things. To stop your tinnitus from getting worse: Do not drink alcohol. Do not have caffeine. Do not use any products that contain nicotine or tobacco. These products include cigarettes, chewing tobacco, and vaping devices, such as e-cigarettes. If you need help quitting, ask your provider. Avoid using too much salt. Get enough sleep each night. Where to find more information American Tinnitus Association: https://www.johnson-hamilton.org/ Contact a health care provider if: Your symptoms last for 3 weeks or longer without stopping. You have sudden hearing loss. Your symptoms get worse or don't get better with home care. You can't manage the stress of living with tinnitus. Get help right away if: You get tinnitus after a head injury. You have tinnitus and: Dizziness. Nausea and vomiting. Loss of balance. A sudden, severe headache. Changes to your eyesight. Weakness in your face, arms, or legs. These symptoms may be an emergency. Get help right away. Call 911. Do not wait to see if the symptoms will go away. Do not drive yourself to the hospital. This information is not intended to replace advice given to you by your health care provider. Make sure you discuss any questions you have with your health care provider. Document Revised: 07/01/2022 Document Reviewed: 07/01/2022 Elsevier Patient Education  2024 Elsevier Inc.      Signed,   Meredith Staggers, MD [facility], [facility] 01/09/23 9:26 AM

## 2023-01-09 NOTE — Patient Instructions (Addendum)
I will refer you to audiology, and ENT. See info below. Riboflavin supplement over the counter is an option. Return to the clinic or go to the nearest emergency room if any of your symptoms worsen or new symptoms occur. Let me know if there are questions.   Tinnitus Tinnitus is when you hear a sound that there's no actual source for. It may sound like ringing in your ears or something else. The sound may be loud, soft, or somewhere in between. It can last for a few seconds or be constant for days. It can come and go. Almost everyone has tinnitus at some point. It's not the same as hearing loss. But you may need to see a health care provider if: It lasts for a long time. It comes back often. You have trouble sleeping and focusing. What are the causes? The cause of tinnitus is often unknown. In some cases, you may get it if: You're around loud noises, such as from machines or music. An object gets stuck in your ear. Earwax builds up in Landscape architect. You drink a lot of alcohol or caffeine. You take certain medicines. You start to lose your hearing. You may also get it from some medical conditions. These may include: Ear or sinus infections. Heart diseases. High blood pressure. Allergies. Mnire's disease. Problems with your thyroid. A tumor. This is a growth of cells that isn't normal. A weak, bulging blood vessel called an aneurysm near your ear. What increases the risk? You may be more likely to get tinnitus if: You're around loud noises a lot. You're older. You drink alcohol. You smoke. What are the signs or symptoms? The main symptom is hearing a sound that there's no source for. It may sound like ringing. It may also sound like: Buzzing. Sizzling. Blowing air. Hissing. Whistling. Other sounds may include: Roaring. Running water. A musical note. Tapping. Humming. You may have symptoms in one ear or both ears. How is this diagnosed? Tinnitus is diagnosed based on your  symptoms, your medical history, and an exam. Your provider may do a full hearing test if your tinnitus: Is in just one ear. Makes it hard for you to hear. Lasts 6 months or longer. You may also need to see an expert in hearing disorders called an audiologist. They may ask you about your symptoms and how tinnitus affects your daily life. You may have other tests done. These may include: A CT scan. An MRI. An angiogram. This shows how blood flows through your blood vessels. How is this treated? Treatment may include: Therapy to help you manage the stress of living with tinnitus. Finding ways to mask or cover the sound of tinnitus. These include: Sound or white noise machines. Devices that fit in your ear and play sounds or music. A loud humidifier. Acoustic neural stimulation. This is when you use headphones to listen to music that has a special signal in it. Over time, this signal may change some of the pathways in your brain. This can make you less sensitive to tinnitus. This treatment is used for very severe cases. Using hearing aids or cochlear implants if your tinnitus is from hearing loss. If your tinnitus is caused by a medical condition, treating the condition may make it go away.  Follow these instructions at home: Managing symptoms     Try to avoid being in loud places or around loud noises. Wear earplugs or headphones when you're around loud noises. Find ways to reduce stress. These may  include meditation, yoga, or deep breathing. Sleep with your head slightly raised. General instructions Take over-the-counter and prescription medicines only as told by your provider. Track the things that cause symptoms (triggers). Try to avoid these things. To stop your tinnitus from getting worse: Do not drink alcohol. Do not have caffeine. Do not use any products that contain nicotine or tobacco. These products include cigarettes, chewing tobacco, and vaping devices, such as e-cigarettes.  If you need help quitting, ask your provider. Avoid using too much salt. Get enough sleep each night. Where to find more information American Tinnitus Association: https://www.johnson-hamilton.org/ Contact a health care provider if: Your symptoms last for 3 weeks or longer without stopping. You have sudden hearing loss. Your symptoms get worse or don't get better with home care. You can't manage the stress of living with tinnitus. Get help right away if: You get tinnitus after a head injury. You have tinnitus and: Dizziness. Nausea and vomiting. Loss of balance. A sudden, severe headache. Changes to your eyesight. Weakness in your face, arms, or legs. These symptoms may be an emergency. Get help right away. Call 911. Do not wait to see if the symptoms will go away. Do not drive yourself to the hospital. This information is not intended to replace advice given to you by your health care provider. Make sure you discuss any questions you have with your health care provider. Document Revised: 07/01/2022 Document Reviewed: 07/01/2022 Elsevier Patient Education  2024 [employer].

## 2023-02-04 ENCOUNTER — Ambulatory Visit
Admission: RE | Admit: 2023-02-04 | Discharge: 2023-02-04 | Disposition: A | Discharge status: Home / Self Care | Payer: BC Managed Care – PPO | Source: Ambulatory Visit | Attending: Obstetrics & Gynecology | Admitting: Obstetrics & Gynecology

## 2023-02-04 DIAGNOSIS — Z1231 Encounter for screening mammogram for malignant neoplasm of breast: Secondary | ICD-10-CM | POA: Diagnosis not present

## 2023-02-14 ENCOUNTER — Encounter (INDEPENDENT_AMBULATORY_CARE_PROVIDER_SITE_OTHER): Payer: Self-pay | Admitting: Otolaryngology

## 2023-02-19 ENCOUNTER — Ambulatory Visit: Payer: BC Managed Care – PPO | Attending: Family Medicine | Admitting: Audiologist

## 2023-02-19 DIAGNOSIS — H9313 Tinnitus, bilateral: Secondary | ICD-10-CM | POA: Diagnosis not present

## 2023-02-19 DIAGNOSIS — H903 Sensorineural hearing loss, bilateral: Secondary | ICD-10-CM | POA: Insufficient documentation

## 2023-02-19 NOTE — Procedures (Signed)
  Outpatient Audiology and [facility] [address] [place], [place]  [address] [phone]  AUDIOLOGICAL  EVALUATION  NAME: [name]     DOB:   1960-05-20      MRN: [id number]                                                                                     DATE: 02/19/2023     REFERENT: Shade Flood, MD STATUS: Outpatient DIAGNOSIS: Tinnitus, Sensorineural Hearing Loss    History: Karen Pineda was seen for an audiological evaluation due to tinnitus in both ears. She had has the ringing for a while, but it got worse after having COVID in August. She hears it in quiet. At night she uses a box fan and sleeps well. The noise of the day keeps her from fixating on it otherwise. Preshus denies any difficulty hearing. She has no pain or pressure in either ear. She has history of anxiety. No other case history reported.   Evaluation:  Otoscopy showed a clear view of the tympanic membranes, bilaterally Tympanometry results were consistent with normal middle ear function, bilaterally   Audiometric testing was completed using Conventional Audiometry techniques with insert earphones and TDH headphones. Test results are consistent with normal hearing 250-2kHz sloping to mild loss 3-8kHz. Speech Recognition Thresholds were obtained at 15 dB HL in the right ear and at 20  dB HL in the left ear. Word Recognition Testing was completed at  40dB SL and [place] scored 100% in each ear.    Results:  The test results were reviewed with Tresa Endo. She has a mild hearing loss in both ears. This damage to the hearing is mostly likely the cause of the tinnitus. Stress, illness, and anxiety have made the tinnitus more bothersome. Masking and tinnitus management packet reviewed. Mild high pitched loss does not make her a hearing aid candidate yet.    Recommendations: 1.   No follow up needed unless new concern arise.    33 minutes spent testing and counseling on results.   If you have any  questions please feel free to contact me at [phone].  Ammie Ferrier Au.D.  Audiologist   02/19/2023  11:59 AM  Cc: Shade Flood, MD

## 2023-03-05 ENCOUNTER — Telehealth: Payer: BC Managed Care – PPO | Admitting: Physician Assistant

## 2023-03-05 DIAGNOSIS — M25512 Pain in left shoulder: Secondary | ICD-10-CM

## 2023-03-05 DIAGNOSIS — S46812A Strain of other muscles, fascia and tendons at shoulder and upper arm level, left arm, initial encounter: Secondary | ICD-10-CM

## 2023-03-05 MED ORDER — PREDNISONE 10 MG (21) PO TBPK: ORAL_TABLET | ORAL | 0 refills | Status: DC

## 2023-03-05 NOTE — Patient Instructions (Signed)
  Karen Pineda, thank you for joining Piedad Climes, PA-C for today's virtual visit.  While this provider is not your primary care provider (PCP), if your PCP is located in our provider database this encounter information will be shared with them immediately following your visit.   A [facility] MyChart account gives you access to today's visit and all your visits, tests, and labs performed at [facility] " click here if you don't have a [facility] MyChart account or go to [email or web address]  Consent: (Patient) Karen Pineda provided verbal consent for this virtual visit at the beginning of the encounter.  Current Medications:  Current Outpatient Medications:    ALPRAZolam (XANAX) 0.25 MG tablet, Take 0.125 mg by mouth daily as needed for anxiety. , Disp: , Rfl:    Cholecalciferol (VITAMIN D) 50 MCG (2000 UT) CAPS, , Disp: , Rfl:    estradiol (DOTTI) 0.05 MG/24HR patch, Place 1 patch onto the skin 2 (two) times a week., Disp: , Rfl:    famotidine (PEPCID) 10 MG tablet, Take 10 mg by mouth 2 (two) times daily., Disp: , Rfl:    ibuprofen (ADVIL,MOTRIN) 200 MG tablet, Take 200 mg by mouth every 6 (six) hours as needed. , Disp: , Rfl:    loratadine (CLARITIN) 10 MG tablet, Take 10 mg by mouth daily as needed for allergies., Disp: , Rfl:    Multiple Vitamins-Minerals (CENTRUM SILVER PO), Take 1 tablet by mouth daily., Disp: , Rfl:    Probiotic Product (PROBIOTIC BLEND PO), , Disp: , Rfl:    Medications ordered in this encounter:  No orders of the defined types were placed in this encounter.    *If you need refills on other medications prior to your next appointment, please contact your pharmacy*  Follow-Up: Call back or seek an in-person evaluation if the symptoms worsen or if the condition fails to improve as anticipated.  [facility] [phone]  Other Instructions Please avoid heavy lifting and overexertion. Switch the Ibuprofen for  Tylenol OTC. Ok to continue heating pad in 10-15 minute intervals. Take the prednisone pack as directed. If symptoms are not resolving, or if you note any new or worsening symptoms despite treatment, please seek an in-person evaluation ASAP.   If you have been instructed to have an in-person evaluation today at a local Urgent Care facility, please use the link below. It will take you to a list of all of our available [facility], including address, phone number and hours of operation. Please do not delay care.  [facility]  If you or a family member do not have a primary care provider, use the link below to schedule a visit and establish care. When you choose a [facility] primary care physician or advanced practice provider, you gain a long-term partner in health. Find a Primary Care Provider  Learn more about [facility]'s in-office and virtual care options: [facility] Now

## 2023-03-05 NOTE — Progress Notes (Signed)
Virtual Visit Consent   Karen Pineda, you are scheduled for a virtual visit with a [facility] provider today. Just as with appointments in the office, your consent must be obtained to participate. Your consent will be active for this visit and any virtual visit you may have with one of our providers in the next 365 days. If you have a MyChart account, a copy of this consent can be sent to you electronically.  As this is a virtual visit, video technology does not allow for your provider to perform a traditional examination. This may limit your provider's ability to fully assess your condition. If your provider identifies any concerns that need to be evaluated in person or the need to arrange testing (such as labs, EKG, etc.), we will make arrangements to do so. Although advances in technology are sophisticated, we cannot ensure that it will always work on either your end or our end. If the connection with a video visit is poor, the visit may have to be switched to a telephone visit. With either a video or telephone visit, we are not always able to ensure that we have a secure connection.  By engaging in this virtual visit, you consent to the provision of healthcare and authorize for your insurance to be billed (if applicable) for the services provided during this visit. Depending on your insurance coverage, you may receive a charge related to this service.  I need to obtain your verbal consent now. Are you willing to proceed with your visit today? Karen Pineda has provided verbal consent on 03/05/2023 for a virtual visit (video or telephone). Piedad Climes, [place]  Date: 03/05/2023 3:42 PM  Virtual Visit via Video Note   I, Piedad Climes, connected with  Karen Pineda  ([id number], [birth year]) on 03/05/23 at  3:30 PM EST by a video-enabled telemedicine application and verified that I am speaking with the correct person using two identifiers.  Location: Patient: Virtual  Visit Location Patient: Home Provider: Virtual Visit Location Provider: Home Office   I discussed the limitations of evaluation and management by telemedicine and the availability of in person appointments. The patient expressed understanding and agreed to proceed.    History of Present Illness: Karen Pineda is a 62 y.o. who identifies as a female who was assigned female at birth, and is being seen today for pain of R neck and shoulder down into her shoulder blade over the past few days. Some increased heavy lifting recently including Sunday, moving older/heavier wooden tables. Patient notes some shooting pain on occasion down the length of the arm. Denies weakness of extremity. Some decreased ROM of neck 2/2 pain level. Had some old 5 mg Flexeril which she has been taking sparingly -- make her sleepy but help somewhat. Also using buprofen OTC. Heating pad.    HPI: HPI  Problems:  Patient Active Problem List   Diagnosis Date Noted   DDD (degenerative disc disease), lumbar 08/18/2017   Situational anxiety 08/18/2017   Ankle pain 12/11/2011    Allergies: No Known Allergies Medications:  Current Outpatient Medications:    predniSONE (STERAPRED UNI-PAK 21 TAB) 10 MG (21) TBPK tablet, Take following package directions, Disp: 21 tablet, Rfl: 0   ALPRAZolam (XANAX) 0.25 MG tablet, Take 0.125 mg by mouth daily as needed for anxiety. , Disp: , Rfl:    Cholecalciferol (VITAMIN D) 50 MCG (2000 UT) CAPS, , Disp: , Rfl:    estradiol (DOTTI) 0.05 MG/24HR patch, Place 1  patch onto the skin 2 (two) times a week., Disp: , Rfl:    famotidine (PEPCID) 10 MG tablet, Take 10 mg by mouth 2 (two) times daily., Disp: , Rfl:    ibuprofen (ADVIL,MOTRIN) 200 MG tablet, Take 200 mg by mouth every 6 (six) hours as needed. , Disp: , Rfl:    loratadine (CLARITIN) 10 MG tablet, Take 10 mg by mouth daily as needed for allergies., Disp: , Rfl:    Multiple Vitamins-Minerals (CENTRUM SILVER PO), Take 1 tablet by mouth  daily., Disp: , Rfl:    Probiotic Product (PROBIOTIC BLEND PO), , Disp: , Rfl:   Observations/Objective: Patient is well-developed, well-nourished in no acute distress.  Resting comfortably at home.  Head is normocephalic, atraumatic.  No labored breathing. Speech is clear and coherent with logical content.  Patient is alert and oriented at baseline.   Assessment and Plan: 1. Acute pain of left shoulder - predniSONE (STERAPRED UNI-PAK 21 TAB) 10 MG (21) TBPK tablet; Take following package directions  Dispense: 21 tablet; Refill: 0  2. Strain of left trapezius muscle, initial encounter - predniSONE (STERAPRED UNI-PAK 21 TAB) 10 MG (21) TBPK tablet; Take following package directions  Dispense: 21 tablet; Refill: 0  Atraumatic. Secondary to strain with lifting/overuse. Suspect MSK strain along with mild nerve irritation. Supportive measures and OTC medications reviewed. Stop Ibuprofen OTC and switch to Tylenol OTC. Prednisone pack per orders. Ok to continue Flexeril in the evening. In-person follow-up precautions reviewed.  Follow Up Instructions: I discussed the assessment and treatment plan with the patient. The patient was provided an opportunity to ask questions and all were answered. The patient agreed with the plan and demonstrated an understanding of the instructions.  A copy of instructions were sent to the patient via MyChart unless otherwise noted below.   The patient was advised to call back or seek an in-person evaluation if the symptoms worsen or if the condition fails to improve as anticipated.    Piedad Climes, PA-C

## 2023-03-25 ENCOUNTER — Ambulatory Visit (INDEPENDENT_AMBULATORY_CARE_PROVIDER_SITE_OTHER): Payer: BC Managed Care – PPO | Admitting: Otolaryngology

## 2023-03-25 ENCOUNTER — Encounter (INDEPENDENT_AMBULATORY_CARE_PROVIDER_SITE_OTHER): Payer: Self-pay

## 2023-03-25 VITALS — Ht 63.0 in | Wt 137.0 lb

## 2023-03-25 DIAGNOSIS — H9313 Tinnitus, bilateral: Secondary | ICD-10-CM | POA: Diagnosis not present

## 2023-03-25 DIAGNOSIS — H903 Sensorineural hearing loss, bilateral: Secondary | ICD-10-CM | POA: Diagnosis not present

## 2023-03-25 DIAGNOSIS — J3089 Other allergic rhinitis: Secondary | ICD-10-CM

## 2023-03-25 NOTE — Progress Notes (Signed)
Dear Dr. Neva Seat, Here is my assessment for our mutual patient, Karen Pineda. Thank you for allowing me the opportunity to care for your patient. Please do not hesitate to contact me should you have any other questions. Sincerely, Dr. Jovita Kussmaul  Otolaryngology Clinic Note Referring provider: Dr. Neva Seat HPI:  Karen Pineda is a 62 y.o. female kindly referred by Dr. Neva Seat for evaluation of tinnitus.  Patient reports: had COVID in August, and then noticed it since then. No other antecedent event. Reports that it is persistent, but it is not bothersome. High pitched. She reports she uses a lot of white noise. Did not really have ear issues during COVID. She does have some allergic rhinitis symptoms. She feels like hearing has possibly decreased some over years but no prior testing ("looking at mouths") Patient denies: ear pain, fullness, vertigo, drainage Patient additionally denies: deep pain in ear canal, eustachian tube symptoms such as popping, crackling, sensitivity to pressure changes Patient also denies barotrauma, vestibular suppressant use, ototoxic medication use Prior ear surgery: no No frequent ear infections.  H&N Surgery: Tonsillectomy (child),  Personal or FHx of bleeding dz or anesthesia difficulty: no  PMHx: GERD, Allergies  AP/AC: no  Tobacco: denies. Occupation: Teacher, early years/pre. Lives in [place], [place]  Independent Review of Additional Tests or Records:  Dr. Neva Seat (01/09/2023): Tinnitus, past 2 months; occurred afte rCOVID. Not affecting sleep No prior CT 02/19/2023 Audiogram was independently reviewed and interpreted by me and it reveals AU: norrmal hearing until 2K Hz and mild SNL 3-8kHz. WRT 100% at 40dB.     SNHL= Sensorineural hearing loss    PMH/Meds/All/SocHx/FamHx/ROS:   Past Medical History:  Diagnosis Date   Allergy    seasonal   Anxiety    occasional   GERD (gastroesophageal reflux disease)    occasional     Past Surgical History:  Procedure  Laterality Date   ABDOMINAL HYSTERECTOMY      Family History  Problem Relation Age of Onset   Depression Mother    Breast cancer Mother 51 - 44   Diabetes Father    Hyperlipidemia Father    Hypertension Father    Breast cancer Maternal Aunt 49 - 59   Breast cancer Maternal Aunt 50 - 59   Ovarian cancer Maternal Aunt 60 - 69   Asthma Maternal Grandmother    Asthma Maternal Grandfather    Breast cancer Paternal Grandmother 49 - 49   Heart disease Paternal Grandmother    Heart disease Paternal Grandfather    Hypertension Paternal Grandfather    Cerebral palsy Brother      Social Connections: Socially Integrated (01/06/2023)   Social Connection and Isolation Panel [NHANES]    Frequency of Communication with Friends and Family: More than three times a week    Frequency of Social Gatherings with Friends and Family: More than three times a week    Attends Religious Services: More than 4 times per year    Active Member of Clubs or Organizations: Yes    Attends Engineer, structural: More than 4 times per year    Marital Status: Married      Current Outpatient Medications:    ALPRAZolam (XANAX) 0.25 MG tablet, Take 0.125 mg by mouth daily as needed for anxiety. , Disp: , Rfl:    Cholecalciferol (VITAMIN D) 50 MCG (2000 UT) CAPS, , Disp: , Rfl:    estradiol (DOTTI) 0.05 MG/24HR patch, Place 1 patch onto the skin 2 (two) times a week., Disp: , Rfl:  famotidine (PEPCID) 10 MG tablet, Take 10 mg by mouth 2 (two) times daily., Disp: , Rfl:    ibuprofen (ADVIL,MOTRIN) 200 MG tablet, Take 200 mg by mouth every 6 (six) hours as needed. , Disp: , Rfl:    loratadine (CLARITIN) 10 MG tablet, Take 10 mg by mouth daily as needed for allergies., Disp: , Rfl:    Multiple Vitamins-Minerals (CENTRUM SILVER PO), Take 1 tablet by mouth daily., Disp: , Rfl:    Probiotic Product (PROBIOTIC BLEND PO), , Disp: , Rfl:    predniSONE (STERAPRED UNI-PAK 21 TAB) 10 MG (21) TBPK tablet, Take following  package directions (Patient not taking: Reported on 03/25/2023), Disp: 21 tablet, Rfl: 0   Physical Exam:   Ht 5\' 3"  (1.6 m)   Wt 137 lb (62.1 kg)   BMI 24.27 kg/m   Salient findings:  CN II-XII intact  Bilateral EAC clear and TM intact with well pneumatized middle ear spaces Weber 512: midline Rinne 512: AC > BC b/l Anterior rhinoscopy: Septum deviates right; bilateral turbinate hypertrophy - modest No lesions of oral cavity/oropharynx No obviously palpable neck masses/lymphadenopathy/thyromegaly No respiratory distress or stridor  Seprately Identifiable Procedures:  None  Impression & Plans:  Delvin Chrzanowski is a 62 y.o. female with:  1. Bilateral tinnitus   2. Non-seasonal allergic rhinitis, unspecified trigger   3. Bilateral sensorineural hearing loss    Bilateral non-pulsatile tinnitus with mild HL. Not particularly bothersome and exam reassuring. Most likely presbycusis We discussed options: 1. Observation 2. Repeat HT intervally 3. Masking 4. HA (likely may not help significantly, at least with hearing) 5. Further imaging  After discussion, she opted for observation, which is reasonable. Discussed f/u, will follow up should things change or worsen - PRN  See below regarding exact medications prescribed this encounter including dosages and route: No orders of the defined types were placed in this encounter.     Thank you for allowing me the opportunity to care for your patient. Please do not hesitate to contact me should you have any other questions.  Sincerely, Jovita Kussmaul, MD Otolarynoglogist (ENT), [facility] Phone: [phone] Fax: [phone]  03/25/2023, 10:20 AM   I have personally spent 47 minutes involved in face-to-face and non-face-to-face activities for this patient on the day of the visit.  Professional time spent excludes any procedures performed but includes the following activities, in addition to those noted in the  documentation: preparing to see the patient (review of outside documentation and results), performing a medically appropriate examination and/or evaluation, counseling and educating the patient/family/caregiver, ordering medications, referring and communicating with other healthcare professionals, documenting clinical information in the electronic or other health record, independently interpreting results and communicating results with the patient/family/caregiver

## 2023-05-07 ENCOUNTER — Telehealth: Payer: BC Managed Care – PPO | Admitting: Physician Assistant

## 2023-05-07 DIAGNOSIS — J019 Acute sinusitis, unspecified: Secondary | ICD-10-CM

## 2023-05-07 DIAGNOSIS — B9689 Other specified bacterial agents as the cause of diseases classified elsewhere: Secondary | ICD-10-CM | POA: Diagnosis not present

## 2023-05-07 MED ORDER — AMOXICILLIN-POT CLAVULANATE 875-125 MG PO TABS: 1.0000 | ORAL_TABLET | Freq: Two times a day (BID) | ORAL | 0 refills | Status: DC

## 2023-05-07 NOTE — Progress Notes (Signed)
Virtual Visit Consent   [name], you are scheduled for a virtual visit with a [facility] provider today. Just as with appointments in the office, your consent must be obtained to participate. Your consent will be active for this visit and any virtual visit you may have with one of our providers in the next 365 days. If you have a MyChart account, a copy of this consent can be sent to you electronically.  As this is a virtual visit, video technology does not allow for your provider to perform a traditional examination. This may limit your provider's ability to fully assess your condition. If your provider identifies any concerns that need to be evaluated in person or the need to arrange testing (such as labs, EKG, etc.), we will make arrangements to do so. Although advances in technology are sophisticated, we cannot ensure that it will always work on either your end or our end. If the connection with a video visit is poor, the visit may have to be switched to a telephone visit. With either a video or telephone visit, we are not always able to ensure that we have a secure connection.  By engaging in this virtual visit, you consent to the provision of healthcare and authorize for your insurance to be billed (if applicable) for the services provided during this visit. Depending on your insurance coverage, you may receive a charge related to this service.  I need to obtain your verbal consent now. Are you willing to proceed with your visit today? [name] has provided verbal consent on 05/07/2023 for a virtual visit (video or telephone). Margaretann Loveless, PA-C  Date: 05/07/2023 12:31 PM  Virtual Visit via Video Note   I, Margaretann Loveless, connected with  Karen Pineda  ([id number], [birth year]) on 05/07/23 at 12:30 PM EST by a video-enabled telemedicine application and verified that I am speaking with the correct person using two identifiers.  Location: Patient: Virtual  Visit Location Patient: Home Provider: Virtual Visit Location Provider: Home Office   I discussed the limitations of evaluation and management by telemedicine and the availability of in person appointments. The patient expressed understanding and agreed to proceed.    History of Present Illness: Karen Pineda is a 63 y.o. who identifies as a female who was assigned female at birth, and is being seen today for possible sinus infection.  HPI: Sinus Problem This is a new problem. The current episode started 1 to 4 weeks ago (3 weeks). The problem has been gradually worsening since onset. There has been no fever. The pain is mild. Associated symptoms include congestion, headaches and sinus pressure. Pertinent negatives include no chills, coughing, ear pain or sore throat. (Eyes aching off and on) Past treatments include acetaminophen (sudafed, tylenol, ibuprofen, saline nasal rinses). The treatment provided mild relief.     Problems:  Patient Active Problem List   Diagnosis Date Noted   DDD (degenerative disc disease), lumbar 08/18/2017   Situational anxiety 08/18/2017   Ankle pain 12/11/2011    Allergies: No Known Allergies Medications:  Current Outpatient Medications:    ALPRAZolam (XANAX) 0.25 MG tablet, Take 0.125 mg by mouth daily as needed for anxiety. , Disp: , Rfl:    amoxicillin-clavulanate (AUGMENTIN) 875-125 MG tablet, Take 1 tablet by mouth 2 (two) times daily., Disp: 14 tablet, Rfl: 0   Cholecalciferol (VITAMIN D) 50 MCG (2000 UT) CAPS, , Disp: , Rfl:    estradiol (DOTTI) 0.05 MG/24HR patch, Place 1 patch  onto the skin 2 (two) times a week., Disp: , Rfl:    famotidine (PEPCID) 10 MG tablet, Take 10 mg by mouth 2 (two) times daily., Disp: , Rfl:    ibuprofen (ADVIL,MOTRIN) 200 MG tablet, Take 200 mg by mouth every 6 (six) hours as needed. , Disp: , Rfl:    loratadine (CLARITIN) 10 MG tablet, Take 10 mg by mouth daily as needed for allergies., Disp: , Rfl:    Multiple  Vitamins-Minerals (CENTRUM SILVER PO), Take 1 tablet by mouth daily., Disp: , Rfl:    predniSONE (STERAPRED UNI-PAK 21 TAB) 10 MG (21) TBPK tablet, Take following package directions (Patient not taking: Reported on 03/25/2023), Disp: 21 tablet, Rfl: 0   Probiotic Product (PROBIOTIC BLEND PO), , Disp: , Rfl:   Observations/Objective: Patient is well-developed, well-nourished in no acute distress.  Resting comfortably at home.  Head is normocephalic, atraumatic.  No labored breathing.  Speech is clear and coherent with logical content.  Patient is alert and oriented at baseline.    Assessment and Plan: 1. Acute bacterial sinusitis (Primary) - amoxicillin-clavulanate (AUGMENTIN) 875-125 MG tablet; Take 1 tablet by mouth 2 (two) times daily.  Dispense: 14 tablet; Refill: 0  - Worsening symptoms that have not responded to OTC medications.  - Will give Augmentin - Continue allergy medications.  - Steam and humidifier can help - Stay well hydrated and get plenty of rest.  - Seek in person evaluation if no symptom improvement or if symptoms worsen   Follow Up Instructions: I discussed the assessment and treatment plan with the patient. The patient was provided an opportunity to ask questions and all were answered. The patient agreed with the plan and demonstrated an understanding of the instructions.  A copy of instructions were sent to the patient via MyChart unless otherwise noted below.    The patient was advised to call back or seek an in-person evaluation if the symptoms worsen or if the condition fails to improve as anticipated.    Margaretann Loveless, PA-C

## 2023-05-07 NOTE — Patient Instructions (Signed)
Karen Pineda, thank you for joining Margaretann Loveless, PA-C for today's virtual visit.  While this provider is not your primary care provider (PCP), if your PCP is located in our provider database this encounter information will be shared with them immediately following your visit.   A [facility] MyChart account gives you access to today's visit and all your visits, tests, and labs performed at [facility] " click here if you don't have a [facility] MyChart account or go to [email or web address]  Consent: (Patient) Karen Pineda provided verbal consent for this virtual visit at the beginning of the encounter.  Current Medications:  Current Outpatient Medications:    ALPRAZolam (XANAX) 0.25 MG tablet, Take 0.125 mg by mouth daily as needed for anxiety. , Disp: , Rfl:    amoxicillin-clavulanate (AUGMENTIN) 875-125 MG tablet, Take 1 tablet by mouth 2 (two) times daily., Disp: 14 tablet, Rfl: 0   Cholecalciferol (VITAMIN D) 50 MCG (2000 UT) CAPS, , Disp: , Rfl:    estradiol (DOTTI) 0.05 MG/24HR patch, Place 1 patch onto the skin 2 (two) times a week., Disp: , Rfl:    famotidine (PEPCID) 10 MG tablet, Take 10 mg by mouth 2 (two) times daily., Disp: , Rfl:    ibuprofen (ADVIL,MOTRIN) 200 MG tablet, Take 200 mg by mouth every 6 (six) hours as needed. , Disp: , Rfl:    loratadine (CLARITIN) 10 MG tablet, Take 10 mg by mouth daily as needed for allergies., Disp: , Rfl:    Multiple Vitamins-Minerals (CENTRUM SILVER PO), Take 1 tablet by mouth daily., Disp: , Rfl:    predniSONE (STERAPRED UNI-PAK 21 TAB) 10 MG (21) TBPK tablet, Take following package directions (Patient not taking: Reported on 03/25/2023), Disp: 21 tablet, Rfl: 0   Probiotic Product (PROBIOTIC BLEND PO), , Disp: , Rfl:    Medications ordered in this encounter:  Meds ordered this encounter  Medications   amoxicillin-clavulanate (AUGMENTIN) 875-125 MG tablet    Sig: Take 1 tablet by mouth 2 (two) times  daily.    Dispense:  14 tablet    Refill:  0    Supervising Provider:   Merrilee Jansky [[id number]]     *If you need refills on other medications prior to your next appointment, please contact your pharmacy*  Follow-Up: Call back or seek an in-person evaluation if the symptoms worsen or if the condition fails to improve as anticipated.  [facility] [phone]  Other Instructions Sinus Infection, Adult A sinus infection, also called sinusitis, is inflammation of your sinuses. Sinuses are hollow spaces in the bones around your face. Your sinuses are located: Around your eyes. In the middle of your forehead. Behind your nose. In your cheekbones. Mucus normally drains out of your sinuses. When your nasal tissues become inflamed or swollen, mucus can become trapped or blocked. This allows bacteria, viruses, and fungi to grow, which leads to infection. Most infections of the sinuses are caused by a virus. A sinus infection can develop quickly. It can last for up to 4 weeks (acute) or for more than 12 weeks (chronic). A sinus infection often develops after a cold. What are the causes? This condition is caused by anything that creates swelling in the sinuses or stops mucus from draining. This includes: Allergies. Asthma. Infection from bacteria or viruses. Deformities or blockages in your nose or sinuses. Abnormal growths in the nose (nasal polyps). Pollutants, such as chemicals or irritants in the air. Infection from fungi. This is  rare. What increases the risk? You are more likely to develop this condition if you: Have a weak body defense system (immune system). Do a lot of swimming or diving. Overuse nasal sprays. Smoke. What are the signs or symptoms? The main symptoms of this condition are pain and a feeling of pressure around the affected sinuses. Other symptoms include: Stuffy nose or congestion that makes it difficult to breathe through your nose. Thick yellow  or greenish drainage from your nose. Tenderness, swelling, and warmth over the affected sinuses. A cough that may get worse at night. Decreased sense of smell and taste. Extra mucus that collects in the throat or the back of the nose (postnasal drip) causing a sore throat or bad breath. Tiredness (fatigue). Fever. How is this diagnosed? This condition is diagnosed based on: Your symptoms. Your medical history. A physical exam. Tests to find out if your condition is acute or chronic. This may include: Checking your nose for nasal polyps. Viewing your sinuses using a device that has a light (endoscope). Testing for allergies or bacteria. Imaging tests, such as an MRI or CT scan. In rare cases, a bone biopsy may be done to rule out more serious types of fungal sinus disease. How is this treated? Treatment for a sinus infection depends on the cause and whether your condition is chronic or acute. If caused by a virus, your symptoms should go away on their own within 10 days. You may be given medicines to relieve symptoms. They include: Medicines that shrink swollen nasal passages (decongestants). A spray that eases inflammation of the nostrils (topical intranasal corticosteroids). Rinses that help get rid of thick mucus in your nose (nasal saline washes). Medicines that treat allergies (antihistamines). Over-the-counter pain relievers. If caused by bacteria, your health care provider may recommend waiting to see if your symptoms improve. Most bacterial infections will get better without antibiotic medicine. You may be given antibiotics if you have: A severe infection. A weak immune system. If caused by narrow nasal passages or nasal polyps, surgery may be needed. Follow these instructions at home: Medicines Take, use, or apply over-the-counter and prescription medicines only as told by your health care provider. These may include nasal sprays. If you were prescribed an antibiotic  medicine, take it as told by your health care provider. Do not stop taking the antibiotic even if you start to feel better. Hydrate and humidify  Drink enough fluid to keep your urine pale yellow. Staying hydrated will help to thin your mucus. Use a cool mist humidifier to keep the humidity level in your home above 50%. Inhale steam for 10-15 minutes, 3-4 times a day, or as told by your health care provider. You can do this in the bathroom while a hot shower is running. Limit your exposure to cool or dry air. Rest Rest as much as possible. Sleep with your head raised (elevated). Make sure you get enough sleep each night. General instructions  Apply a warm, moist washcloth to your face 3-4 times a day or as told by your health care provider. This will help with discomfort. Use nasal saline washes as often as told by your health care provider. Wash your hands often with soap and water to reduce your exposure to germs. If soap and water are not available, use hand sanitizer. Do not smoke. Avoid being around people who are smoking (secondhand smoke). Keep all follow-up visits. This is important. Contact a health care provider if: You have a fever. Your symptoms get  worse. Your symptoms do not improve within 10 days. Get help right away if: You have a severe headache. You have persistent vomiting. You have severe pain or swelling around your face or eyes. You have vision problems. You develop confusion. Your neck is stiff. You have trouble breathing. These symptoms may be an emergency. Get help right away. Call 911. Do not wait to see if the symptoms will go away. Do not drive yourself to the hospital. Summary A sinus infection is soreness and inflammation of your sinuses. Sinuses are hollow spaces in the bones around your face. This condition is caused by nasal tissues that become inflamed or swollen. The swelling traps or blocks the flow of mucus. This allows bacteria, viruses, and  fungi to grow, which leads to infection. If you were prescribed an antibiotic medicine, take it as told by your health care provider. Do not stop taking the antibiotic even if you start to feel better. Keep all follow-up visits. This is important. This information is not intended to replace advice given to you by your health care provider. Make sure you discuss any questions you have with your health care provider. Document Revised: 02/27/2021 Document Reviewed: 02/27/2021 Elsevier Patient Education  2024 [employer]   If you have been instructed to have an in-person evaluation today at a local Urgent Care facility, please use the link below. It will take you to a list of all of our available [facility], including address, phone number and hours of operation. Please do not delay care.  [facility]  If you or a family member do not have a primary care provider, use the link below to schedule a visit and establish care. When you choose a [facility] primary care physician or advanced practice provider, you gain a long-term partner in health. Find a Primary Care Provider  Learn more about [facility]'s in-office and virtual care options: [facility] Get Care Now

## 2023-09-09 ENCOUNTER — Other Ambulatory Visit: Payer: Self-pay | Admitting: Obstetrics & Gynecology

## 2023-09-09 DIAGNOSIS — Z1231 Encounter for screening mammogram for malignant neoplasm of breast: Secondary | ICD-10-CM

## 2023-09-25 DIAGNOSIS — H10412 Chronic giant papillary conjunctivitis, left eye: Secondary | ICD-10-CM | POA: Diagnosis not present

## 2023-11-05 ENCOUNTER — Ambulatory Visit (INDEPENDENT_AMBULATORY_CARE_PROVIDER_SITE_OTHER): Payer: BC Managed Care – PPO | Admitting: Family Medicine

## 2023-11-05 ENCOUNTER — Encounter: Payer: Self-pay | Admitting: Family Medicine

## 2023-11-05 VITALS — BP 114/74 | HR 71 | Temp 97.6°F | Ht 63.5 in | Wt 135.0 lb

## 2023-11-05 DIAGNOSIS — E785 Hyperlipidemia, unspecified: Secondary | ICD-10-CM | POA: Diagnosis not present

## 2023-11-05 DIAGNOSIS — Z87898 Personal history of other specified conditions: Secondary | ICD-10-CM

## 2023-11-05 DIAGNOSIS — R7309 Other abnormal glucose: Secondary | ICD-10-CM

## 2023-11-05 DIAGNOSIS — I4510 Unspecified right bundle-branch block: Secondary | ICD-10-CM

## 2023-11-05 DIAGNOSIS — Z23 Encounter for immunization: Secondary | ICD-10-CM

## 2023-11-05 DIAGNOSIS — E559 Vitamin D deficiency, unspecified: Secondary | ICD-10-CM | POA: Diagnosis not present

## 2023-11-05 DIAGNOSIS — Z0000 Encounter for general adult medical examination without abnormal findings: Secondary | ICD-10-CM | POA: Diagnosis not present

## 2023-11-05 LAB — CBC
HCT: 38.1 % (ref 36.0–46.0)
Hemoglobin: 12.6 g/dL (ref 12.0–15.0)
MCHC: 32.9 g/dL (ref 30.0–36.0)
MCV: 87.1 fl (ref 78.0–100.0)
Platelets: 264 K/uL (ref 150.0–400.0)
RBC: 4.37 Mil/uL (ref 3.87–5.11)
RDW: 13.2 % (ref 11.5–15.5)
WBC: 6.5 K/uL (ref 4.0–10.5)

## 2023-11-05 LAB — COMPREHENSIVE METABOLIC PANEL WITH GFR
ALT: 17 U/L (ref 0–35)
AST: 21 U/L (ref 0–37)
Albumin: 4.7 g/dL (ref 3.5–5.2)
Alkaline Phosphatase: 49 U/L (ref 39–117)
BUN: 14 mg/dL (ref 6–23)
CO2: 31 meq/L (ref 19–32)
Calcium: 9.6 mg/dL (ref 8.4–10.5)
Chloride: 101 meq/L (ref 96–112)
Creatinine, Ser: 0.79 mg/dL (ref 0.40–1.20)
GFR: 78.6 mL/min (ref >60.00)
Glucose, Bld: 93 mg/dL (ref 70–99)
Potassium: 4 meq/L (ref 3.5–5.1)
Sodium: 137 meq/L (ref 135–145)
Total Bilirubin: 0.5 mg/dL (ref 0.2–1.2)
Total Protein: 7.5 g/dL (ref 6.0–8.3)

## 2023-11-05 LAB — LIPID PANEL
Cholesterol: 234 mg/dL — ABNORMAL HIGH (ref 0–200)
HDL: 90.7 mg/dL (ref >39.00)
LDL Cholesterol: 130 mg/dL — ABNORMAL HIGH (ref 0–99)
NonHDL: 142.84
Total CHOL/HDL Ratio: 3
Triglycerides: 65 mg/dL (ref 0.0–149.0)
VLDL: 13 mg/dL (ref 0.0–40.0)

## 2023-11-05 LAB — HEMOGLOBIN A1C: Hgb A1c MFr Bld: 6.1 % (ref 4.6–6.5)

## 2023-11-05 LAB — VITAMIN D 25 HYDROXY (VIT D DEFICIENCY, FRACTURES): VITD: 34.86 ng/mL (ref 30.00–100.00)

## 2023-11-05 NOTE — Patient Instructions (Signed)
 Thanks for coming in today.  I will check some lab work and let you know if there are any concerns.  Pneumonia vaccine given today.  If any new or more frequent palpitations please be seen.  Keep follow-up with other specialists as planned.  I do recommend the flu vaccine and would consider the COVID booster as well in the fall.  Please let me know if you have any questions and follow-up in 1 year for medication review.  Take care!   Health Maintenance After Age 24 After age 65, you are at a higher risk for certain long-term diseases and infections as well as injuries from falls. Falls are a major cause of broken bones and head injuries in people who are older than age 70. Getting regular preventive care can help to keep you healthy and well. Preventive care includes getting regular testing and making lifestyle changes as recommended by your health care provider. Talk with your health care provider about: Which screenings and tests you should have. A screening is a test that checks for a disease when you have no symptoms. A diet and exercise plan that is right for you. What should I know about screenings and tests to prevent falls? Screening and testing are the best ways to find a health problem early. Early diagnosis and treatment give you the best chance of managing medical conditions that are common after age 16. Certain conditions and lifestyle choices may make you more likely to have a fall. Your health care provider may recommend: Regular vision checks. Poor vision and conditions such as cataracts can make you more likely to have a fall. If you wear glasses, make sure to get your prescription updated if your vision changes. Medicine review. Work with your health care provider to regularly review all of the medicines you are taking, including over-the-counter medicines. Ask your health care provider about any side effects that may make you more likely to have a fall. Tell your health care provider if  any medicines that you take make you feel dizzy or sleepy. Strength and balance checks. Your health care provider may recommend certain tests to check your strength and balance while standing, walking, or changing positions. Foot health exam. Foot pain and numbness, as well as not wearing proper footwear, can make you more likely to have a fall. Screenings, including: Osteoporosis screening. Osteoporosis is a condition that causes the bones to get weaker and break more easily. Blood pressure screening. Blood pressure changes and medicines to control blood pressure can make you feel dizzy. Depression screening. You may be more likely to have a fall if you have a fear of falling, feel depressed, or feel unable to do activities that you used to do. Alcohol use screening. Using too much alcohol can affect your balance and may make you more likely to have a fall. Follow these instructions at home: Lifestyle Do not drink alcohol if: Your health care provider tells you not to drink. If you drink alcohol: Limit how much you have to: 0-1 drink a day for women. 0-2 drinks a day for men. Know how much alcohol is in your drink. In the [place] one drink equals one 12 oz bottle of beer (355 mL), one 5 oz glass of wine (148 mL), or one 1 oz glass of hard liquor (44 mL). Do not use any products that contain nicotine or tobacco. These products include cigarettes, chewing tobacco, and vaping devices, such as e-cigarettes. If you need help quitting, ask your  health care provider. Activity  Follow a regular exercise program to stay fit. This will help you maintain your balance. Ask your health care provider what types of exercise are appropriate for you. If you need a cane or walker, use it as recommended by your health care provider. Wear supportive shoes that have nonskid soles. Safety  Remove any tripping hazards, such as rugs, cords, and clutter. Install safety equipment such as grab bars in bathrooms and  safety rails on stairs. Keep rooms and walkways well-lit. General instructions Talk with your health care provider about your risks for falling. Tell your health care provider if: You fall. Be sure to tell your health care provider about all falls, even ones that seem minor. You feel dizzy, tiredness (fatigue), or off-balance. Take over-the-counter and prescription medicines only as told by your health care provider. These include supplements. Eat a healthy diet and maintain a healthy weight. A healthy diet includes low-fat dairy products, low-fat (lean) meats, and fiber from whole grains, beans, and lots of fruits and vegetables. Stay current with your vaccines. Schedule regular health, dental, and eye exams. Summary Having a healthy lifestyle and getting preventive care can help to protect your health and wellness after age 65. Screening and testing are the best way to find a health problem early and help you avoid having a fall. Early diagnosis and treatment give you the best chance for managing medical conditions that are more common for people who are older than age 68. Falls are a major cause of broken bones and head injuries in people who are older than age 2. Take precautions to prevent a fall at home. Work with your health care provider to learn what changes you can make to improve your health and wellness and to prevent falls. This information is not intended to replace advice given to you by your health care provider. Make sure you discuss any questions you have with your health care provider. Document Revised: 08/14/2020 Document Reviewed: 08/14/2020 Elsevier Patient Education  2024 [employer].

## 2023-11-05 NOTE — Progress Notes (Signed)
 Subjective:  Patient ID: Karen Pineda, female    DOB: 1960/12/09  Age: 63 y.o. MRN: [id number]  CC:  Chief Complaint  Patient presents with   Annual Exam    HPI Karen Pineda presents for follow up. Initially scheduled as physical - now on Medicare.  No concerns.   PCP, me Audiometry, Lauraine Ka, tinnitus, sensorineural hearing loss.  ENT, Dr. Tobie. Dermatology, Dr.Stinehelfer, actinic keratosis, seborrheic keratosis, melanocytic nevi. Ortho/back, Dr. Beuford, prior treatment for spondylosis, vertebrogenic low back pain. Gynecology, Dr. Dannielle, has treated with alprazolam for menopausal syndrome. Appt tomorrow.  Cardiology, Dr. Burnard, evaluated in 2022 with right bundle branch block. Rare palpitations when anxious only. No chest pains.   Situational anxiety Low-dose alprazolam as needed, infrequent use.  Refilled by GYN as above.  Some difficulty with sleep at times when discussed at her physical last year.Controlled substance database reviewed.  #30 last filled on 10/29/2022, previously 05/03/2022. Less use recently. Still few left. May be coming off estradiol patch.   Allergic rhinitis Claritin as needed or zyrtec. Option of flonase - has at home.   Hyperlipidemia Diet/exercise approach without family history of early heart disease. The 10-year ASCVD risk score (Arnett DK, et al., 2019) is: 3.8%   Values used to calculate the score:     Age: 38 years     Clincally relevant sex: Female     Is Non-Hispanic African American: No     Diabetic: No     Tobacco smoker: No     Systolic Blood Pressure: 114 mmHg     Is BP treated: No     HDL Cholesterol: 88.5 mg/dL     Total Cholesterol: 220 mg/dL Lab Results  Component Value Date   CHOL 220 (H) 10/31/2022   HDL 88.50 10/31/2022   LDLCALC 120 (H) 10/31/2022   TRIG 60.0 10/31/2022   CHOLHDL 2 10/31/2022   Prediabetes: Diet/exercise approach.  Weight has improved. Lab Results  Component Value Date   HGBA1C 5.8  10/31/2022   Wt Readings from Last 3 Encounters:  11/05/23 135 lb (61.2 kg)  03/25/23 137 lb (62.1 kg)  01/09/23 142 lb (64.4 kg)       11/05/2023    8:22 AM 01/09/2023    8:57 AM 10/31/2022    8:12 AM 08/30/2021    9:02 AM 08/23/2020    2:38 PM  Depression screen PHQ 2/9  Decreased Interest 0 0 0 0 0  Down, Depressed, Hopeless 0 0 0 0 0  PHQ - 2 Score 0 0 0 0 0  Altered sleeping  1 1 1    Tired, decreased energy  0 1 1   Change in appetite  0 0 0   Feeling bad or failure about yourself   0 0 0   Trouble concentrating  0 0 0   Moving slowly or fidgety/restless  0 0 0   Suicidal thoughts  0 0 0   PHQ-9 Score  1 2 2    Difficult doing work/chores  Not difficult at all Not difficult at all       Fall screening    11/05/2023    8:16 AM 01/09/2023    8:57 AM 10/31/2022    8:12 AM 08/30/2021    9:02 AM 08/23/2020    2:38 PM  Fall Risk   Falls in the past year? 0 0 0 1 0  Number falls in past yr: 0 0 0 0   Injury with Fall? 0 0 0  0   Risk for fall due to : No Fall Risks No Fall Risks No Fall Risks History of fall(s)   Follow up Falls evaluation completed Falls evaluation completed Falls evaluation completed Falls evaluation completed  Falls evaluation completed      Data saved with a previous flowsheet row definition   Lighting in home: adequate.  Loose rugs/carpets/pets: no loose rugs, no pets.  Stairs: 2 flights with handrails.  Grab bars in bathroom: none Timed up and go: 6 seconds, normal gait, no instability.   Functional Status Survey: Is the patient deaf or have difficulty hearing?: No Does the patient have difficulty seeing, even when wearing glasses/contacts?: No Does the patient have difficulty concentrating, remembering, or making decisions?: No Does the patient have difficulty walking or climbing stairs?: No Does the patient have difficulty dressing or bathing?: No Does the patient have difficulty doing errands alone such as visiting a doctor's office or  shopping?: No  Memory Screen:    11/05/2023    9:10 AM  6CIT Screen  What Year? 0 points  What month? 0 points  What time? 0 points  Count back from 20 0 points  Months in reverse 0 points  Repeat phrase 0 points  Total Score 0 points   Advanced Directives:  Has living will? Will be meeting with attorney, and will send a copy if available.       11/05/2023    8:22 AM 01/09/2023    8:57 AM 10/31/2022    8:12 AM 08/30/2021    9:02 AM 08/23/2020    2:38 PM  Depression screen PHQ 2/9  Decreased Interest 0 0 0 0 0  Down, Depressed, Hopeless 0 0 0 0 0  PHQ - 2 Score 0 0 0 0 0  Altered sleeping  1 1 1    Tired, decreased energy  0 1 1   Change in appetite  0 0 0   Feeling bad or failure about yourself   0 0 0   Trouble concentrating  0 0 0   Moving slowly or fidgety/restless  0 0 0   Suicidal thoughts  0 0 0   PHQ-9 Score  1 2 2    Difficult doing work/chores  Not difficult at all Not difficult at all      Health Maintenance  Topic Date Due   Cervical Cancer Screening (HPV/Pap Cotest)  Never done   COVID-19 Vaccine (3 - 2024-25 season) 12/08/2022   DEXA SCAN  Never done   Pneumococcal Vaccine: 50+ Years (1 of 1 - PCV) 11/04/2024 (Originally 08/09/2008)   INFLUENZA VACCINE  11/07/2023   Medicare Annual Wellness (AWV)  11/04/2024   MAMMOGRAM  02/03/2025   DTaP/Tdap/Td (2 - Td or Tdap) 08/19/2027   Colonoscopy  04/21/2029   Hepatitis C Screening  Completed   HIV Screening  Completed   Zoster Vaccines- Shingrix   Completed   Hepatitis B Vaccines  Aged Out   HPV VACCINES  Aged Out   Meningococcal B Vaccine  Aged Out  Colonoscopy 2021, repeat 5 years Prior hysterectomy, prior bone density looked okay. GYN appt this week.  Last mammogram in October 2024, no mammographic evidence of malignancy, repeat planned this October. Dermatology eval in past year - appt next month.   Immunization History  Administered Date(s) Administered   Influenza,inj,Quad PF,6+ Mos 02/13/2017,  02/16/2018, 01/05/2019   Influenza-Unspecified 01/06/2021   PFIZER(Purple Top)SARS-COV-2 Vaccination 07/22/2019, 08/18/2019   PPD Test 02/16/2018   Tdap 08/18/2017   Zoster Recombinant(Shingrix ) 02/09/2019,  06/17/2019  Pneumonia vaccine today.  Recommended flu booster in the fall, option for covid booster.    Vision Screening   Right eye Left eye Both eyes  Without correction     With correction 20/20 20/20 20/20    Regular ophtho follow-up. Appt next month.   Dental:Within Last 6 months  Alcohol: None  Tobacco: None  Exercise: Limited previously by thigh injury, prior runner, then more walking. Still some walking - 3 days per week. Walking to 1 hr.  Low vitamin D  in past, around 27,  no current supplement.   History Patient Active Problem List   Diagnosis Date Noted   DDD (degenerative disc disease), lumbar 08/18/2017   Situational anxiety 08/18/2017   Ankle pain 12/11/2011   Past Medical History:  Diagnosis Date   Allergy    seasonal   Anxiety    occasional   GERD (gastroesophageal reflux disease)    occasional   Past Surgical History:  Procedure Laterality Date   ABDOMINAL HYSTERECTOMY     No Known Allergies Prior to Admission medications   Medication Sig Start Date End Date Taking? Authorizing Provider  ALPRAZolam (XANAX) 0.25 MG tablet Take 0.125 mg by mouth daily as needed for anxiety.    Yes [provider]  estradiol (DOTTI) 0.05 MG/24HR patch Place 1 patch onto the skin 2 (two) times a week. 10/29/22  Yes [provider]  famotidine (PEPCID) 10 MG tablet Take 10 mg by mouth 2 (two) times daily.   Yes [provider]  ibuprofen (ADVIL,MOTRIN) 200 MG tablet Take 200 mg by mouth every 6 (six) hours as needed.    Yes [provider]  loratadine (CLARITIN) 10 MG tablet Take 10 mg by mouth daily as needed for allergies.   Yes [provider]  Multiple Vitamins-Minerals (CENTRUM SILVER PO) Take 1 tablet by mouth  daily.   Yes [provider]  Probiotic Product (PROBIOTIC BLEND PO)    Yes [provider]  amoxicillin -clavulanate (AUGMENTIN ) 875-125 MG tablet Take 1 tablet by mouth 2 (two) times daily. 05/07/23   Vivienne Delon HERO, PA-C  Cholecalciferol (VITAMIN D ) 50 MCG (2000 UT) CAPS     [provider]  predniSONE  (STERAPRED UNI-PAK 21 TAB) 10 MG (21) TBPK tablet Take following package directions Patient not taking: Reported on 03/25/2023 03/05/23   Gladis Elsie BROCKS, PA-C   Social History   Socioeconomic History   Marital status: Married    Spouse name: Not on file   Number of children: Not on file   Years of education: Not on file   Highest education level: Professional school degree (e.g., MD, DDS, DVM, JD)  Occupational History   Not on file  Tobacco Use   Smoking status: Never   Smokeless tobacco: Never  Substance and Sexual Activity   Alcohol use: Never   Drug use: Never   Sexual activity: Not Currently  Other Topics Concern   Not on file  Social History Narrative   Not on file   Social Drivers of Health   Financial Resource Strain: Low Risk  (11/04/2023)   Overall Financial Resource Strain (CARDIA)    Difficulty of Paying Living Expenses: Not hard at all  Food Insecurity: No Food Insecurity (11/04/2023)   Hunger Vital Sign    Worried About Running Out of Food in the Last Year: Never true    Ran Out of Food in the Last Year: Never true  Transportation Needs: No Transportation Needs (11/04/2023)   PRAPARE -  Administrator, Civil Service (Medical): No    Lack of Transportation (Non-Medical): No  Physical Activity: Sufficiently Active (11/04/2023)   Exercise Vital Sign    Days of Exercise per Week: 3 days    Minutes of Exercise per Session: 50 min  Stress: No Stress Concern Present (11/04/2023)   [employer] of Occupational Health - Occupational Stress Questionnaire    Feeling of Stress: Only a little  Social Connections: Socially  Integrated (11/04/2023)   Social Connection and Isolation Panel    Frequency of Communication with Friends and Family: More than three times a week    Frequency of Social Gatherings with Friends and Family: Twice a week    Attends Religious Services: More than 4 times per year    Active Member of [employer] or Organizations: Yes    Attends Engineer, structural: More than 4 times per year    Marital Status: Married  Catering manager Violence: Not on file    Review of Systems 13 point review of systems per patient health survey noted.  Negative other than as indicated above or in HPI.    Objective:   Vitals:   11/05/23 0817  BP: 114/74  Pulse: 71  Temp: 97.6 F (36.4 C)  TempSrc: Oral  SpO2: 97%  Weight: 135 lb (61.2 kg)  Height: 5' 3.5 (1.613 m)     Physical Exam Constitutional:      Appearance: She is well-developed.  HENT:     Head: Normocephalic and atraumatic.     Right Ear: External ear normal.     Left Ear: External ear normal.  Eyes:     Conjunctiva/sclera: Conjunctivae normal.     Pupils: Pupils are equal, round, and reactive to light.  Neck:     Thyroid : No thyromegaly.  Cardiovascular:     Rate and Rhythm: Normal rate and regular rhythm.     Heart sounds: Normal heart sounds. No murmur heard. Pulmonary:     Effort: Pulmonary effort is normal. No respiratory distress.     Breath sounds: Normal breath sounds. No wheezing.  Abdominal:     General: Bowel sounds are normal.     Palpations: Abdomen is soft.     Tenderness: There is no abdominal tenderness.  Musculoskeletal:        General: No tenderness. Normal range of motion.     Cervical back: Normal range of motion and neck supple.  Lymphadenopathy:     Cervical: No cervical adenopathy.  Skin:    General: Skin is warm and dry.     Findings: No rash.  Neurological:     Mental Status: She is alert and oriented to person, place, and time.  Psychiatric:        Behavior: Behavior normal.         Thought Content: Thought content normal.      EKG, sinus rhythm, rate 60, right bundle branch block, no significant changes compared to 11/09/2020 EKG  Assessment & Plan:  Karen Pineda is a 63 y.o. female . Welcome to Medicare preventive visit  - - anticipatory guidance as below in AVS, screening labs if needed. Health maintenance items as above in HPI discussed/recommended as applicable.  - no concerning responses on depression, fall, or functional status screening. Any positive responses noted as above. Advanced directives discussed as in CHL.   Hyperlipidemia, unspecified hyperlipidemia type - Plan: Comprehensive metabolic panel with GFR, Lipid panel  - Check labs, low ASCVD risk score, continue  diet/exercise approach likely.  Elevated hemoglobin A1c - Plan: Hemoglobin A1c  - prior borderline elevated reading. Repeat labs and adjust plan accordingly, continue to watch diet/exercise.   Need for pneumococcal vaccination - Plan: Pneumococcal conjugate vaccine 20-valent  palpitations - Plan: EKG 12-Lead, CBC RBBB - Plan: EKG 12-Lead  - Infrequent palpitations, usually more stress/anxiety related, denies new symptoms or more frequent symptoms, evaluated previously.  No concerning findings on EKG with chronic RBBB.  RTC precautions.  No orders of the defined types were placed in this encounter.  Patient Instructions  Thanks for coming in today.  I will check some lab work and let you know if there are any concerns.  Pneumonia vaccine given today.  If any new or more frequent palpitations please be seen.  Keep follow-up with other specialists as planned.  I do recommend the flu vaccine and would consider the COVID booster as well in the fall.  Please let me know if you have any questions and follow-up in 1 year for medication review.  Take care!   Health Maintenance After Age 14 After age 67, you are at a higher risk for certain long-term diseases and infections as well as injuries  from falls. Falls are a major cause of broken bones and head injuries in people who are older than age 56. Getting regular preventive care can help to keep you healthy and well. Preventive care includes getting regular testing and making lifestyle changes as recommended by your health care provider. Talk with your health care provider about: Which screenings and tests you should have. A screening is a test that checks for a disease when you have no symptoms. A diet and exercise plan that is right for you. What should I know about screenings and tests to prevent falls? Screening and testing are the best ways to find a health problem early. Early diagnosis and treatment give you the best chance of managing medical conditions that are common after age 37. Certain conditions and lifestyle choices may make you more likely to have a fall. Your health care provider may recommend: Regular vision checks. Poor vision and conditions such as cataracts can make you more likely to have a fall. If you wear glasses, make sure to get your prescription updated if your vision changes. Medicine review. Work with your health care provider to regularly review all of the medicines you are taking, including over-the-counter medicines. Ask your health care provider about any side effects that may make you more likely to have a fall. Tell your health care provider if any medicines that you take make you feel dizzy or sleepy. Strength and balance checks. Your health care provider may recommend certain tests to check your strength and balance while standing, walking, or changing positions. Foot health exam. Foot pain and numbness, as well as not wearing proper footwear, can make you more likely to have a fall. Screenings, including: Osteoporosis screening. Osteoporosis is a condition that causes the bones to get weaker and break more easily. Blood pressure screening. Blood pressure changes and medicines to control blood pressure can  make you feel dizzy. Depression screening. You may be more likely to have a fall if you have a fear of falling, feel depressed, or feel unable to do activities that you used to do. Alcohol use screening. Using too much alcohol can affect your balance and may make you more likely to have a fall. Follow these instructions at home: Lifestyle Do not drink alcohol if: Your health care  provider tells you not to drink. If you drink alcohol: Limit how much you have to: 0-1 drink a day for women. 0-2 drinks a day for men. Know how much alcohol is in your drink. In the [place] one drink equals one 12 oz bottle of beer (355 mL), one 5 oz glass of wine (148 mL), or one 1 oz glass of hard liquor (44 mL). Do not use any products that contain nicotine or tobacco. These products include cigarettes, chewing tobacco, and vaping devices, such as e-cigarettes. If you need help quitting, ask your health care provider. Activity  Follow a regular exercise program to stay fit. This will help you maintain your balance. Ask your health care provider what types of exercise are appropriate for you. If you need a cane or walker, use it as recommended by your health care provider. Wear supportive shoes that have nonskid soles. Safety  Remove any tripping hazards, such as rugs, cords, and clutter. Install safety equipment such as grab bars in bathrooms and safety rails on stairs. Keep rooms and walkways well-lit. General instructions Talk with your health care provider about your risks for falling. Tell your health care provider if: You fall. Be sure to tell your health care provider about all falls, even ones that seem minor. You feel dizzy, tiredness (fatigue), or off-balance. Take over-the-counter and prescription medicines only as told by your health care provider. These include supplements. Eat a healthy diet and maintain a healthy weight. A healthy diet includes low-fat dairy products, low-fat (lean) meats, and  fiber from whole grains, beans, and lots of fruits and vegetables. Stay current with your vaccines. Schedule regular health, dental, and eye exams. Summary Having a healthy lifestyle and getting preventive care can help to protect your health and wellness after age 61. Screening and testing are the best way to find a health problem early and help you avoid having a fall. Early diagnosis and treatment give you the best chance for managing medical conditions that are more common for people who are older than age 44. Falls are a major cause of broken bones and head injuries in people who are older than age 28. Take precautions to prevent a fall at home. Work with your health care provider to learn what changes you can make to improve your health and wellness and to prevent falls. This information is not intended to replace advice given to you by your health care provider. Make sure you discuss any questions you have with your health care provider. Document Revised: 08/14/2020 Document Reviewed: 08/14/2020 Elsevier Patient Education  2024 Elsevier Inc.    Signed,   Reyes Pines, MD [facility], [facility] 11/05/23 9:33 AM

## 2023-11-06 DIAGNOSIS — Z01419 Encounter for gynecological examination (general) (routine) without abnormal findings: Secondary | ICD-10-CM | POA: Diagnosis not present

## 2023-11-06 DIAGNOSIS — Z6824 Body mass index (BMI) 24.0-24.9, adult: Secondary | ICD-10-CM | POA: Diagnosis not present

## 2023-11-07 ENCOUNTER — Ambulatory Visit: Payer: Self-pay | Admitting: Family Medicine

## 2023-11-27 DIAGNOSIS — H524 Presbyopia: Secondary | ICD-10-CM | POA: Diagnosis not present

## 2023-12-10 DIAGNOSIS — L578 Other skin changes due to chronic exposure to nonionizing radiation: Secondary | ICD-10-CM | POA: Diagnosis not present

## 2023-12-10 DIAGNOSIS — L570 Actinic keratosis: Secondary | ICD-10-CM | POA: Diagnosis not present

## 2023-12-10 DIAGNOSIS — D225 Melanocytic nevi of trunk: Secondary | ICD-10-CM | POA: Diagnosis not present

## 2023-12-10 DIAGNOSIS — L821 Other seborrheic keratosis: Secondary | ICD-10-CM | POA: Diagnosis not present

## 2023-12-10 DIAGNOSIS — L814 Other melanin hyperpigmentation: Secondary | ICD-10-CM | POA: Diagnosis not present

## 2023-12-16 ENCOUNTER — Encounter: Payer: Self-pay | Admitting: Family Medicine

## 2023-12-16 DIAGNOSIS — E785 Hyperlipidemia, unspecified: Secondary | ICD-10-CM

## 2023-12-17 NOTE — Telephone Encounter (Signed)
 Patient provided POA as well as lab results please advise if anything.  POA printed labeled and sent to scan to be entered correctly

## 2023-12-19 NOTE — Telephone Encounter (Signed)
Replied by mychart.

## 2023-12-22 NOTE — Telephone Encounter (Signed)
 Patient responded. Please see message below. Thank you

## 2023-12-23 NOTE — Addendum Note (Signed)
 Addended by: Shyteria Lewis R on: 12/23/2023 12:15 PM   Modules accepted: Orders

## 2024-02-05 ENCOUNTER — Ambulatory Visit
Admission: RE | Admit: 2024-02-05 | Discharge: 2024-02-05 | Disposition: A | Discharge status: Home / Self Care | Source: Ambulatory Visit | Attending: Obstetrics & Gynecology | Admitting: Obstetrics & Gynecology

## 2024-02-05 DIAGNOSIS — Z1231 Encounter for screening mammogram for malignant neoplasm of breast: Secondary | ICD-10-CM

## 2024-02-06 DIAGNOSIS — M8588 Other specified disorders of bone density and structure, other site: Secondary | ICD-10-CM | POA: Diagnosis not present

## 2024-02-07 IMAGING — MR MR BREAST BILAT WO/W CM
8 of 12 series · 33 of 48 positions shown · IV contrast (gadavist)
Comparison: Bilateral screening mammogram dated 01/05/2021.
Bilateral breast MRI dated 07/19/2019, 12/04/2017 and 11/06/2016.

CLINICAL DATA: Family history of breast cancer in her mother at age
68 and 2 maternal aunts, 1 at age 55 and the other at age 70. Her
paternal grandmother also had breast cancer diagnosed at age 50.
Calculated lifetime risk of developing breast cancer of 21.6%. High
risk screening.

EXAM:
BILATERAL BREAST MRI WITH AND WITHOUT CONTRAST
TECHNIQUE: Multiplanar, multisequence MR images of both breasts were obtained
prior to and following the intravenous administration of 6 ml of
Gadavist

[Series 2: t2_tirm_tra ipat (a-p) · axial · 3.0mm · 0.70mm/px · 1 of 55 slices shown]
[im 1/55]
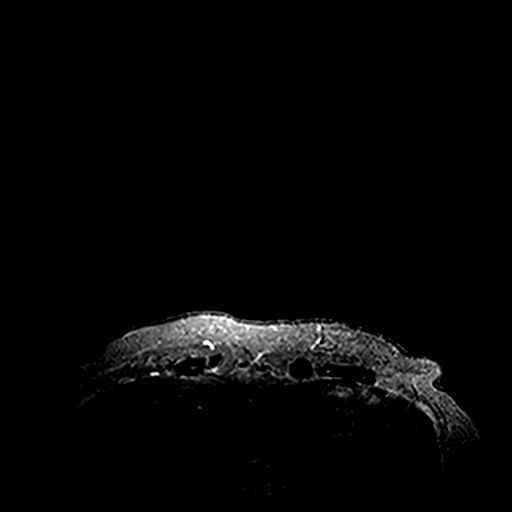

[Series 3: fl3d pre-cm no · axial · non-contrast · 1.2mm · 0.89mm/px · z∈[-62,+110]mm · 5 of 144 slices shown]
[im 1/144]
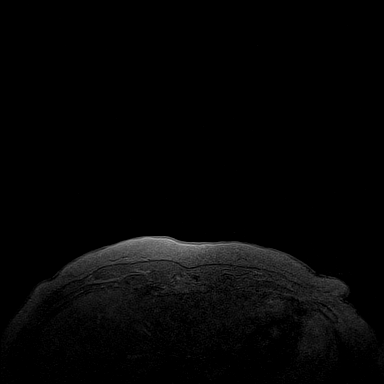
[im 36/144]
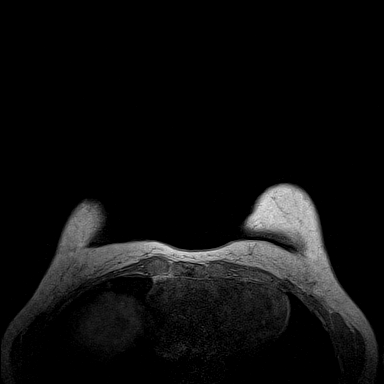
[im 72/144]
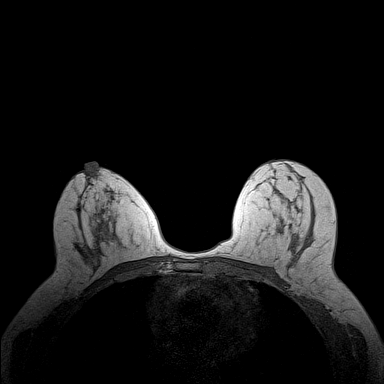
[im 108/144]
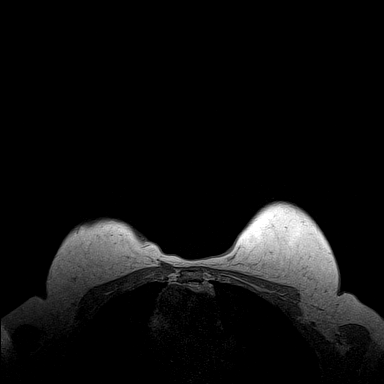
[im 144/144]
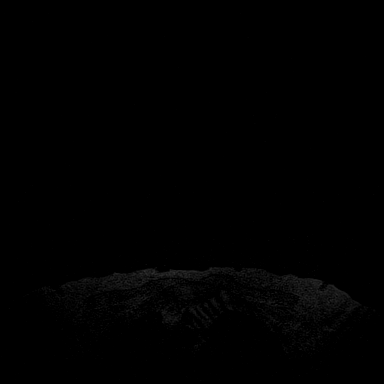

[Series 4: fl3d pre-cm · axial · non-contrast · 1.2mm · 0.89mm/px · z∈[-62,+110]mm · 5 of 144 slices shown]
[im 1/144]
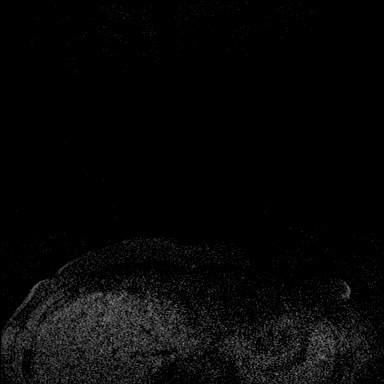
[im 36/144]
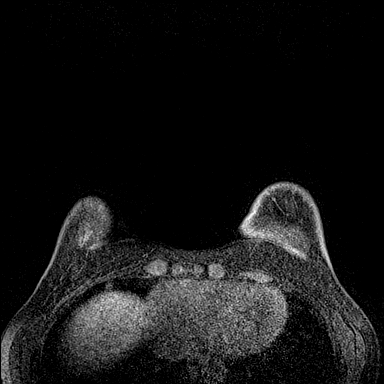
[im 72/144]
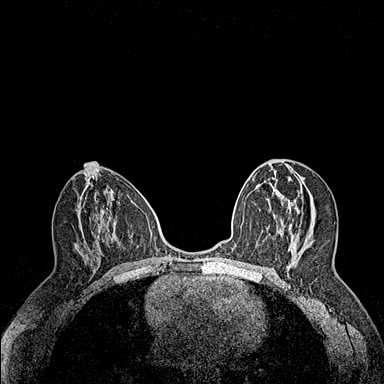
[im 108/144]
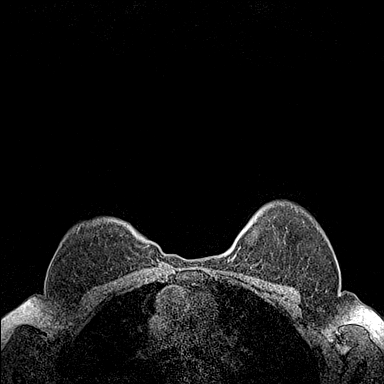
[im 144/144]
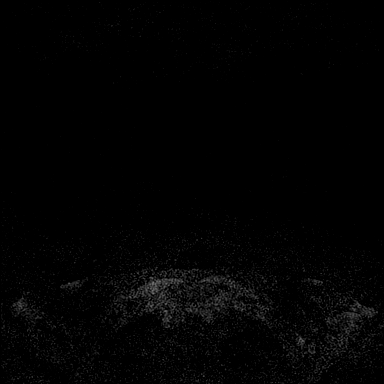

[Series 5: fl3d post-cm 20 · axial · 1.2mm · 0.89mm/px · z∈[-62,+110]mm · 5 of 144 slices shown (1 of 3)]
[im 1/144]
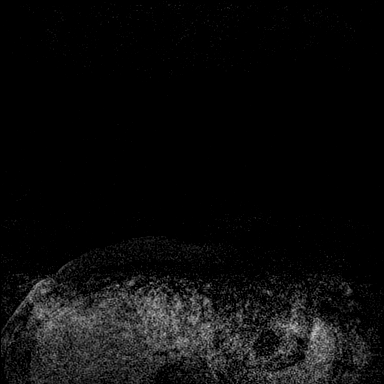
[im 36/144]
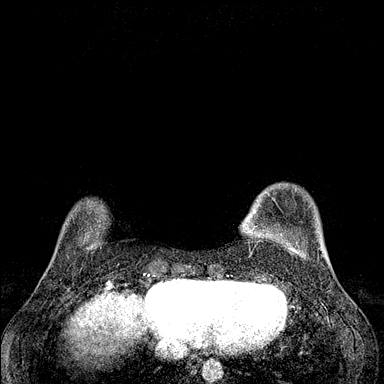
[im 72/144]
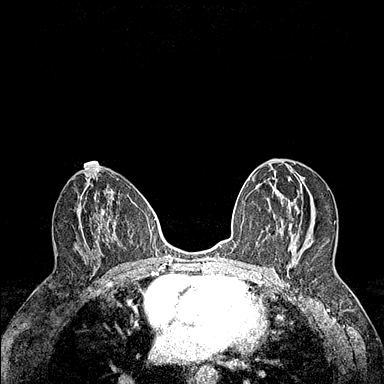
[im 108/144]
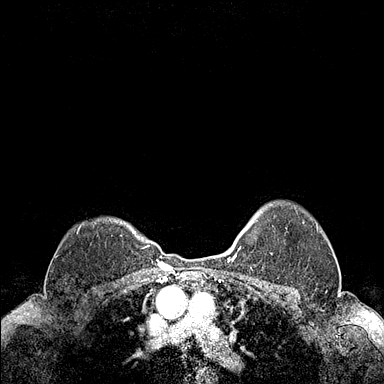
[im 144/144]
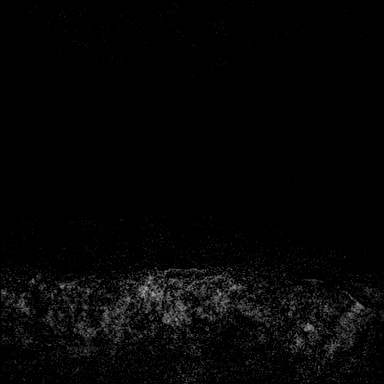

[Series 6: fl3d post-cm 20 · axial · 1.2mm · 0.89mm/px · z∈[-62,+110]mm · 5 of 144 slices shown (2 of 3)]
[im 1/144]
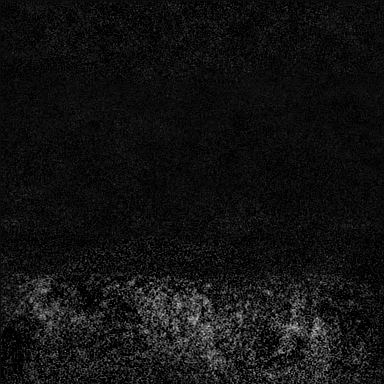
[im 36/144]
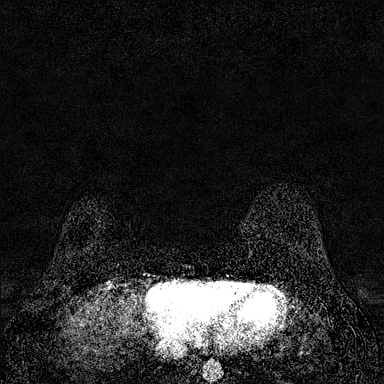
[im 72/144]
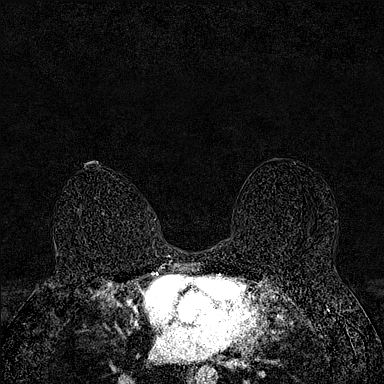
[im 108/144]
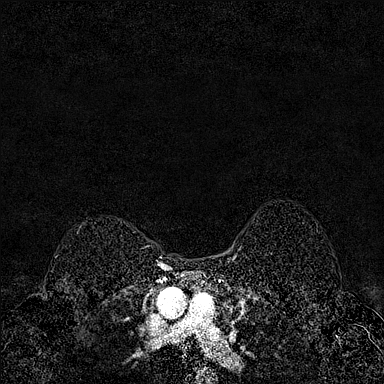
[im 144/144]
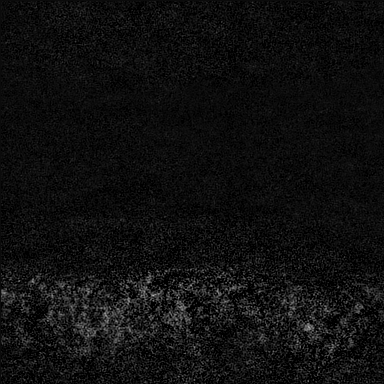

[Series 7: fl3d post-cm 20 · axial · 172.8mm · 0.89mm/px · 1 of 1 slices shown (3 of 3)]
[im 1/1]
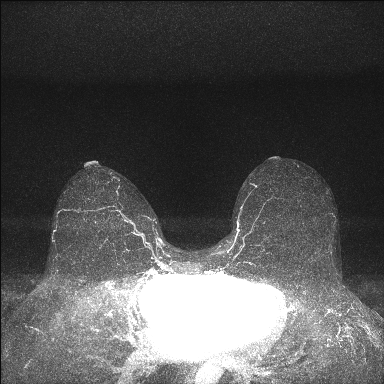

[Series 8: fl3d post-cm 3 · axial · 1.2mm · 0.89mm/px · z∈[-62,+110]mm · 6 of 144 slices shown (1 of 2)]
[im 1/144]
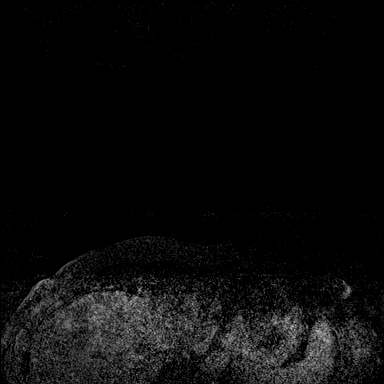
[im 29/144]
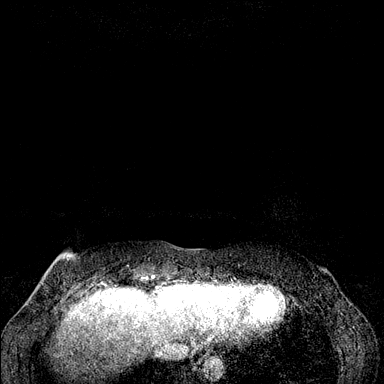
[im 58/144]
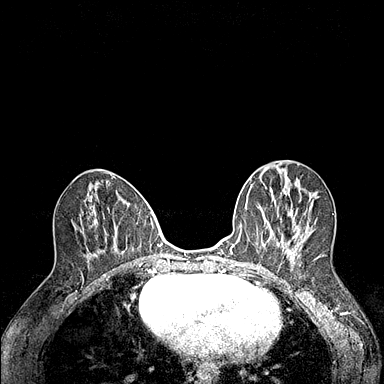
[im 86/144]
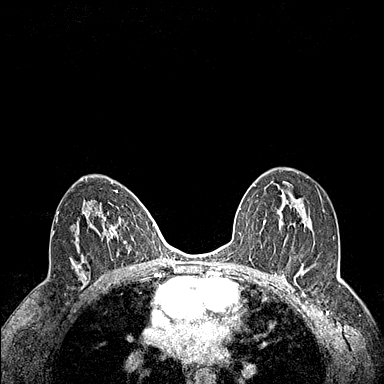
[im 115/144]
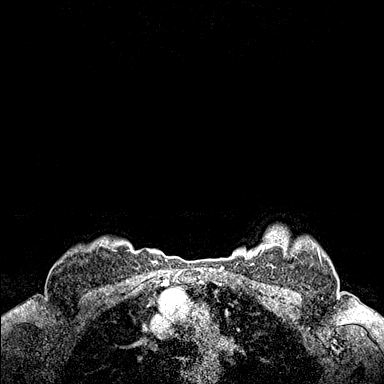
[im 144/144]
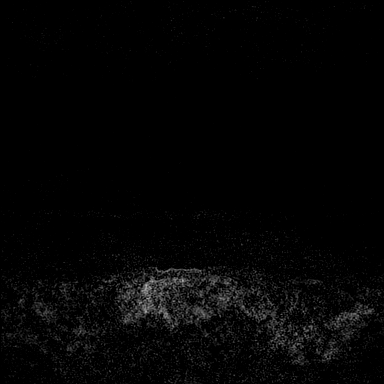

[Series 9: fl3d post-cm 3 · axial · 1.2mm · 0.89mm/px · z∈[-62,+75]mm · 5 of 144 slices shown (2 of 2)]
[im 1/144]
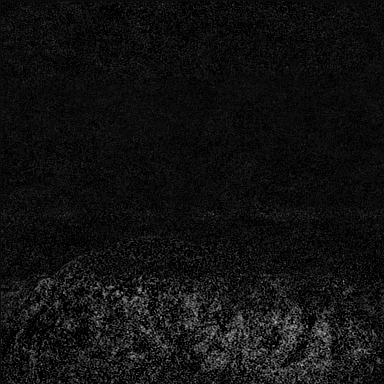
[im 29/144]
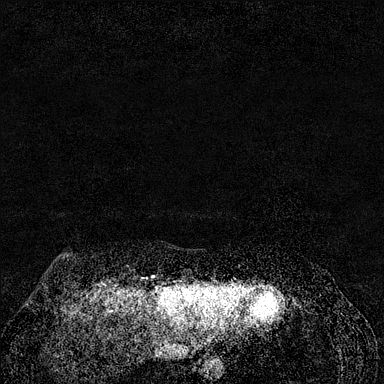
[im 58/144]
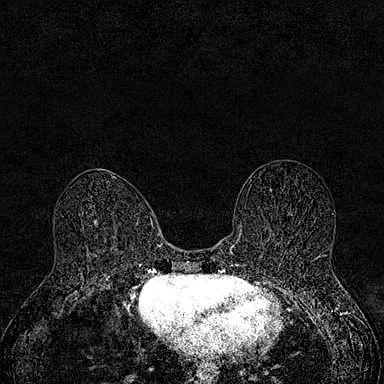
[im 86/144]
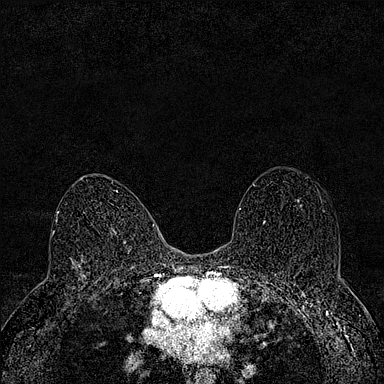
[im 115/144]
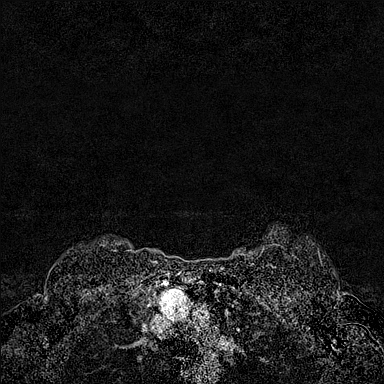

[33 of 48 positions shown; findings below may reference images not displayed]

Three-dimensional MR images were rendered by post-processing of the
original MR data on an independent workstation. The
three-dimensional MR images were interpreted, and findings are
reported in the following complete MRI report for this study. Three
dimensional images were evaluated at the independent interpreting
workstation using the DynaCAD thin client.
FINDINGS: Breast composition: c. Heterogeneous fibroglandular tissue.

Background parenchymal enhancement: Minimal.

Right breast: No significant change in asymmetrical enhancing
fibroglandular tissue in the posterior outer right breast. No mass
or abnormal enhancement.

Left breast: No mass or abnormal enhancement.

Lymph nodes: No abnormal appearing lymph nodes.

Ancillary findings:  None.
IMPRESSION: No evidence of malignancy.

RECOMMENDATION:
1. Bilateral screening mammogram in December 2021 when due.
2. Bilateral screening MRI of the breasts in 1 year.

BI-RADS CATEGORY  1: Negative.

## 2024-02-25 ENCOUNTER — Ambulatory Visit (HOSPITAL_BASED_OUTPATIENT_CLINIC_OR_DEPARTMENT_OTHER): Admitting: Internal Medicine

## 2024-02-25 ENCOUNTER — Encounter (HOSPITAL_BASED_OUTPATIENT_CLINIC_OR_DEPARTMENT_OTHER): Payer: Self-pay | Admitting: Internal Medicine

## 2024-02-25 VITALS — BP 112/60 | HR 69 | Ht 63.0 in | Wt 137.8 lb

## 2024-02-25 DIAGNOSIS — R9389 Abnormal findings on diagnostic imaging of other specified body structures: Secondary | ICD-10-CM | POA: Diagnosis not present

## 2024-02-25 DIAGNOSIS — E785 Hyperlipidemia, unspecified: Secondary | ICD-10-CM

## 2024-02-25 NOTE — Patient Instructions (Signed)
 Medication Instructions:   Your physician recommends that you continue on your current medications as directed. Please refer to the Current Medication list given to you today.    Labwork:  ANYTIME IN THE NEAR FUTURE--LIPOPROTEIN A--PLEASE COME FASTING TO THIS LAB APPOINTMENT   Testing/Procedures:  CARDIAC CALCIUM SCORE (SELF PAY)    Follow-Up:  AS NEEDED WITH DR. HILTY

## 2024-02-26 NOTE — Progress Notes (Signed)
 LIPID CLINIC CONSULT NOTE  Chief Complaint:  Manage dyslipidemia  Primary Care Physician: Levora Reyes SAUNDERS, MD  Primary Cardiologist:  None  HPI:  Karen Pineda is a 63 y.o. female who is being seen today for the evaluation of dyslipidemia at the request of Levora Reyes SAUNDERS, MD. this is a pleasant 63 year old female kindly referred for evaluation management of dyslipidemia.  She had a recent lipid profile showing total cholesterol 234, HDL 90, triglycerides 65 and LDL 130.  She does report a longstanding history of high HDL cholesterol and generally is felt to be fairly healthy.  She is interested in further restratification, however and that is the reason for the visit today.  There is some history of heart disease however in remote relatives.  We discussed the physiology of HDL cholesterol and the U-shaped curve hypothesis indicating that patients with very low and very high HDL cholesterol are typically at higher risk.  She has few traditional cardiovascular risk factors and would be classified as low risk.  She is not currently taking any medicine.  She has no hypertension or diabetes however did have impaired fasting glucose with an A1c 6.1% in July.  She also had a [facility] screening test which indicated a slightly increased carotid velocity which might suggest some carotid artery disease however it was not detailed enough to indicate that there is increase in plaque volume and this increased velocity could also be due to tortuosity of the vessel  PMHx:  Past Medical History:  Diagnosis Date   Allergy    seasonal   Anxiety    occasional   Arrhythmia    GERD (gastroesophageal reflux disease)    occasional   Hyperlipidemia     Past Surgical History:  Procedure Laterality Date   ABDOMINAL HYSTERECTOMY      FAMHx:  Family History  Problem Relation Age of Onset   Depression Mother    Breast cancer Mother 84 - 74   Cancer Mother    Diabetes Father     Hyperlipidemia Father    Hypertension Father    Heart disease Father    Breast cancer Maternal Aunt 64 - 34   Cancer Maternal Aunt    Breast cancer Maternal Aunt 50 - 59   Ovarian cancer Maternal Aunt 60 - 69   Asthma Maternal Grandmother    Asthma Maternal Grandfather    Breast cancer Paternal Grandmother 62 - 61   Heart disease Paternal Grandmother    Heart disease Paternal Grandfather    Hypertension Paternal Grandfather    Cerebral palsy Brother    Birth defects Brother    Cancer Maternal Aunt     SOCHx:   reports that she has never smoked. She has been exposed to tobacco smoke. She has never used smokeless tobacco. She reports that she does not drink alcohol and does not use drugs.  ALLERGIES:  No Known Allergies  ROS: Pertinent items noted in HPI and remainder of comprehensive ROS otherwise negative.  HOME MEDS: Current Outpatient Medications on File Prior to Visit  Medication Sig Dispense Refill   ALPRAZolam (XANAX) 0.25 MG tablet Take 0.125 mg by mouth daily as needed for anxiety.      DOTTI 0.0375 MG/24HR Place 1 patch onto the skin 2 (two) times a week.     famotidine (PEPCID) 10 MG tablet Take 10 mg by mouth 2 (two) times daily.     ibuprofen (ADVIL,MOTRIN) 200 MG tablet Take 200 mg by mouth every 6 (  six) hours as needed.      loratadine (CLARITIN) 10 MG tablet Take 10 mg by mouth daily as needed for allergies.     Multiple Vitamins-Minerals (CENTRUM SILVER PO) Take 1 tablet by mouth daily.     Probiotic Product (PROBIOTIC BLEND PO)      No current facility-administered medications on file prior to visit.    LABS/IMAGING: No results found for this or any previous visit (from the past 48 hours). No results found.  LIPID PANEL:    Component Value Date/Time   CHOL 234 (H) 11/05/2023 0949   CHOL 215 (H) 09/30/2018 0902   TRIG 65.0 11/05/2023 0949   HDL 90.70 11/05/2023 0949   HDL 97 09/30/2018 0902   CHOLHDL 3 11/05/2023 0949   VLDL 13.0 11/05/2023 0949    LDLCALC 130 (H) 11/05/2023 0949   LDLCALC 106 (H) 09/30/2018 0902    No results found for: LIPOA   WEIGHTS: Wt Readings from Last 3 Encounters:  02/25/24 137 lb 12.8 oz (62.5 kg)  11/05/23 135 lb (61.2 kg)  03/25/23 137 lb (62.1 kg)    VITALS: BP 112/60 (BP Location: Left Arm, Patient Position: Sitting, Cuff Size: Normal)   Pulse 69   Ht 5' 3 (1.6 m)   Wt 137 lb 12.8 oz (62.5 kg)   SpO2 98%   BMI 24.41 kg/m   EXAM: Deferred  EKG: Deferred  ASSESSMENT: Dyslipidemia, goal LDL less than 100 Possible carotid atherosclerosis  PLAN: 1.   Ms. Gavina has some remote heart disease in the family and a dyslipidemia with a target LDL less than 100.  There is possible atherosclerosis of the carotid arteries in which case I would recommend a lower LDL target less than 70.  I think we should look for more cardiovascular disease and would recommend a calcium score.  We could also consider repeat carotid Dopplers to indicate whether or not she actually has some carotid atherosclerosis.  Based on that we could consider additional therapies however she is hesitant to be on statin therapy.  Thanks again for the kind referral.  Vinie KYM Maxcy, MD, [facility], FNLA, FACP  [place]  [facility]  Medical Director of the [facility]  [facility] Diplomate of the American Board of Clinical Lipidology Attending Cardiologist  Direct Dial: [phone]  Fax: [phone]  Website:  [email or web address] Vinie BROCKS Hillis Mcphatter 02/26/2024, 12:15 PM

## 2024-03-08 ENCOUNTER — Ambulatory Visit (HOSPITAL_COMMUNITY)
Admission: RE | Admit: 2024-03-08 | Discharge: 2024-03-08 | Disposition: A | Payer: Self-pay | Source: Ambulatory Visit | Attending: Internal Medicine | Admitting: Internal Medicine

## 2024-03-08 ENCOUNTER — Ambulatory Visit: Payer: Self-pay | Admitting: Internal Medicine

## 2024-03-08 DIAGNOSIS — E785 Hyperlipidemia, unspecified: Secondary | ICD-10-CM | POA: Insufficient documentation

## 2024-03-09 LAB — LIPOPROTEIN A (LPA): Lipoprotein (a): 31 nmol/L (ref <75.0)

## 2024-04-22 ENCOUNTER — Telehealth: Admitting: Family Medicine

## 2024-04-22 DIAGNOSIS — B9689 Other specified bacterial agents as the cause of diseases classified elsewhere: Secondary | ICD-10-CM | POA: Diagnosis not present

## 2024-04-22 DIAGNOSIS — J019 Acute sinusitis, unspecified: Secondary | ICD-10-CM

## 2024-04-22 MED ORDER — PREDNISONE 20 MG PO TABS: 20.0000 mg | ORAL_TABLET | Freq: Two times a day (BID) | ORAL | 0 refills | Status: AC

## 2024-04-22 MED ORDER — AMOXICILLIN-POT CLAVULANATE 875-125 MG PO TABS: 1.0000 | ORAL_TABLET | Freq: Two times a day (BID) | ORAL | 0 refills | Status: AC

## 2024-04-22 NOTE — Patient Instructions (Signed)

## 2024-04-22 NOTE — Progress Notes (Signed)
 " Virtual Visit Consent   Karen Pineda, you are scheduled for a virtual visit with a [facility] provider today. Just as with appointments in the office, your consent must be obtained to participate. Your consent will be active for this visit and any virtual visit you may have with one of our providers in the next 365 days. If you have a MyChart account, a copy of this consent can be sent to you electronically.  As this is a virtual visit, video technology does not allow for your provider to perform a traditional examination. This may limit your provider's ability to fully assess your condition. If your provider identifies any concerns that need to be evaluated in person or the need to arrange testing (such as labs, EKG, etc.), we will make arrangements to do so. Although advances in technology are sophisticated, we cannot ensure that it will always work on either your end or our end. If the connection with a video visit is poor, the visit may have to be switched to a telephone visit. With either a video or telephone visit, we are not always able to ensure that we have a secure connection.  By engaging in this virtual visit, you consent to the provision of healthcare and authorize for your insurance to be billed (if applicable) for the services provided during this visit. Depending on your insurance coverage, you may receive a charge related to this service.  I need to obtain your verbal consent now. Are you willing to proceed with your visit today? Naw Lasala has provided verbal consent on 04/22/2024 for a virtual visit (video or telephone). Karen Lamp, FNP  Date: 04/22/2024 4:56 PM   Virtual Visit via Video Note   I, Karen Pineda, connected with  Karen Pineda  ([id number], 04/05/1961) on 04/22/24 at  4:30 PM EST by a video-enabled telemedicine application and verified that I am speaking with the correct person using two identifiers.  Location: Patient: Virtual Visit Location  Patient: Home Provider: Virtual Visit Location Provider: Home Office   I discussed the limitations of evaluation and management by telemedicine and the availability of in person appointments. The patient expressed understanding and agreed to proceed.    History of Present Illness: Karen Pineda is a 64 y.o. who identifies as a female who was assigned female at birth, and is being seen today for sinus pressure and pain since before Christmas. Some allergy problems from helping a friend clear out a dusty house. Worsening. Headaches. Using flonase, saline. Mucus thick and not coming out. Mild cough.   HPI: HPI  Problems:  Patient Active Problem List   Diagnosis Date Noted   DDD (degenerative disc disease), lumbar 08/18/2017   Situational anxiety 08/18/2017   Ankle pain 12/11/2011    Allergies: Allergies[1] Medications: Current Medications[2]  Observations/Objective: Patient is well-developed, well-nourished in no acute distress.  Resting comfortably  at home.  Head is normocephalic, atraumatic.  No labored breathing.  Speech is clear and coherent with logical content.  Patient is alert and oriented at baseline.    Assessment and Plan: 1. Acute bacterial sinusitis (Primary)  Increase fluids humidifier at night, tylenol or ibuprofen as directed, UC as needed.   Follow Up Instructions: I discussed the assessment and treatment plan with the patient. The patient was provided an opportunity to ask questions and all were answered. The patient agreed with the plan and demonstrated an understanding of the instructions.  A copy of instructions were sent to the patient  via MyChart unless otherwise noted below.     The patient was advised to call back or seek an in-person evaluation if the symptoms worsen or if the condition fails to improve as anticipated.    Marquia Costello, FNP     [1] No Known Allergies [2]  Current Outpatient Medications:    amoxicillin -clavulanate (AUGMENTIN )  875-125 MG tablet, Take 1 tablet by mouth 2 (two) times daily for 10 days., Disp: 20 tablet, Rfl: 0   predniSONE  (DELTASONE ) 20 MG tablet, Take 1 tablet (20 mg total) by mouth 2 (two) times daily with a meal for 5 days., Disp: 10 tablet, Rfl: 0   ALPRAZolam (XANAX) 0.25 MG tablet, Take 0.125 mg by mouth daily as needed for anxiety. , Disp: , Rfl:    DOTTI 0.0375 MG/24HR, Place 1 patch onto the skin 2 (two) times a week., Disp: , Rfl:    famotidine (PEPCID) 10 MG tablet, Take 10 mg by mouth 2 (two) times daily., Disp: , Rfl:    ibuprofen (ADVIL,MOTRIN) 200 MG tablet, Take 200 mg by mouth every 6 (six) hours as needed. , Disp: , Rfl:    loratadine (CLARITIN) 10 MG tablet, Take 10 mg by mouth daily as needed for allergies., Disp: , Rfl:    Multiple Vitamins-Minerals (CENTRUM SILVER PO), Take 1 tablet by mouth daily., Disp: , Rfl:    Probiotic Product (PROBIOTIC BLEND PO), , Disp: , Rfl:   "

## 2024-11-04 ENCOUNTER — Ambulatory Visit: Admitting: Family Medicine
